# Patient Record
Sex: Female | Born: 1965 | Race: Black or African American | Hispanic: No | Marital: Single | Smoking: Never smoker
Health system: Southern US, Community
[De-identification: ages and names within clinical notes are randomized; demographics above are authoritative.]

## PROBLEM LIST (undated history)

## (undated) DIAGNOSIS — N6452 Nipple discharge: Secondary | ICD-10-CM

## (undated) DIAGNOSIS — I4892 Unspecified atrial flutter: Secondary | ICD-10-CM

## (undated) DIAGNOSIS — N62 Hypertrophy of breast: Secondary | ICD-10-CM

## (undated) DIAGNOSIS — D219 Benign neoplasm of connective and other soft tissue, unspecified: Secondary | ICD-10-CM

## (undated) DIAGNOSIS — M1990 Unspecified osteoarthritis, unspecified site: Secondary | ICD-10-CM

## (undated) DIAGNOSIS — I10 Essential (primary) hypertension: Secondary | ICD-10-CM

## (undated) DIAGNOSIS — T7840XA Allergy, unspecified, initial encounter: Secondary | ICD-10-CM

## (undated) DIAGNOSIS — R7303 Prediabetes: Secondary | ICD-10-CM

## (undated) HISTORY — DX: Benign neoplasm of connective and other soft tissue, unspecified: D21.9

## (undated) HISTORY — DX: Prediabetes: R73.03

## (undated) HISTORY — PX: BREAST EXCISIONAL BIOPSY: SUR124

## (undated) HISTORY — PX: UTERINE FIBROID SURGERY: SHX826

## (undated) HISTORY — DX: Unspecified atrial flutter: I48.92

## (undated) HISTORY — DX: Allergy, unspecified, initial encounter: T78.40XA

## (undated) HISTORY — DX: Essential (primary) hypertension: I10

---

## 2000-01-28 ENCOUNTER — Other Ambulatory Visit: Admission: RE | Admit: 2000-01-28 | Discharge: 2000-01-28 | Payer: Self-pay | Admitting: Obstetrics and Gynecology

## 2001-04-18 ENCOUNTER — Other Ambulatory Visit: Admission: RE | Admit: 2001-04-18 | Discharge: 2001-04-18 | Payer: Self-pay | Admitting: Obstetrics and Gynecology

## 2003-05-10 ENCOUNTER — Other Ambulatory Visit: Admission: RE | Admit: 2003-05-10 | Discharge: 2003-05-10 | Payer: Self-pay | Admitting: Obstetrics and Gynecology

## 2004-07-15 ENCOUNTER — Other Ambulatory Visit: Admission: RE | Admit: 2004-07-15 | Discharge: 2004-07-15 | Payer: Self-pay | Admitting: Obstetrics and Gynecology

## 2005-11-09 DIAGNOSIS — D219 Benign neoplasm of connective and other soft tissue, unspecified: Secondary | ICD-10-CM

## 2005-11-09 HISTORY — DX: Benign neoplasm of connective and other soft tissue, unspecified: D21.9

## 2005-11-25 ENCOUNTER — Ambulatory Visit (HOSPITAL_COMMUNITY): Admission: RE | Admit: 2005-11-25 | Discharge: 2005-11-25 | Payer: Self-pay | Admitting: Obstetrics and Gynecology

## 2006-03-30 ENCOUNTER — Ambulatory Visit (HOSPITAL_COMMUNITY): Admission: RE | Admit: 2006-03-30 | Discharge: 2006-03-30 | Payer: Self-pay | Admitting: Obstetrics and Gynecology

## 2008-06-12 ENCOUNTER — Ambulatory Visit (HOSPITAL_COMMUNITY): Admission: RE | Admit: 2008-06-12 | Discharge: 2008-06-12 | Payer: Self-pay | Admitting: Obstetrics and Gynecology

## 2010-11-30 ENCOUNTER — Encounter: Payer: Self-pay | Admitting: Obstetrics and Gynecology

## 2014-02-02 ENCOUNTER — Encounter: Payer: Self-pay | Admitting: Cardiovascular Disease

## 2014-02-02 ENCOUNTER — Encounter (INDEPENDENT_AMBULATORY_CARE_PROVIDER_SITE_OTHER): Payer: Self-pay

## 2014-02-02 ENCOUNTER — Ambulatory Visit (INDEPENDENT_AMBULATORY_CARE_PROVIDER_SITE_OTHER): Payer: Commercial Managed Care - PPO | Admitting: Cardiovascular Disease

## 2014-02-02 VITALS — BP 142/90 | HR 85 | Ht 67.0 in | Wt 208.5 lb

## 2014-02-02 DIAGNOSIS — I498 Other specified cardiac arrhythmias: Secondary | ICD-10-CM

## 2014-02-02 DIAGNOSIS — R9431 Abnormal electrocardiogram [ECG] [EKG]: Secondary | ICD-10-CM

## 2014-02-02 DIAGNOSIS — E669 Obesity, unspecified: Secondary | ICD-10-CM

## 2014-02-02 DIAGNOSIS — I4902 Ventricular flutter: Secondary | ICD-10-CM

## 2014-02-02 NOTE — Patient Instructions (Signed)
You are doing well. No arrhythmia noted on the EKG.  No medication changes were made.  Look on [facility] for TED hose/compression hose for leg/ankle swelling  Please call us if you have new issues that need to be addressed before your next appt.

## 2014-02-02 NOTE — Assessment & Plan Note (Signed)
Recent EKG suggesting atrial flutter is incorrect, this is from underlying artifact. EKG is essentially normal with normal sinus rhythm. No further workup needed. Clinical exam is essentially benign. No murmurs appreciated.

## 2014-02-02 NOTE — Assessment & Plan Note (Signed)
We had a long discussion about diet changes that she can make. She is unable to exercise on a regular basis given her busy work schedule. She'll try the low carbohydrate diet.

## 2014-02-02 NOTE — Progress Notes (Signed)
Patient ID: Vicki French, female    DOB: 09/25/1966, 48 y.o.   MRN: [id number]  HPI Comments: Vicki French is a very pleasant 48 year old woman with no prior cardiac history who presents by referral from [place] for evaluation of abnormal EKG, possible atrial flutter.  She reports that she went in for recent routine followup with primary care and she was told that her EKG was abnormal. She denies any fluttering, shortness of breath, near syncope or syncope. Otherwise she feels well. She try to work on her weight and feels that she is "top heavy".  She is otherwise active, works 12 hours per day, no children. Denies any significant smoking history, no diabetes.. EKG today shows normal sinus rhythm with rate 85 beats per minute with no significant ST or T wave changes Prior EKG from July 2011 showing normal sinus rhythm with T wave abnormality to the anterior precordial leads V1 through V4  EKG in question on 01/27/2012 shows normal sinus rhythm with rate 75 beats per minute with artifact EKG read atrial flutter but this is incorrect. If this were true, atrial rate would be 700 to 900 beats per minute which is not possible Typical atrial rate can run 300 beats per minute. This is likely machine artifact or one of the leads/wires has a malfunction Patient does not have a tremor     Outpatient Encounter Prescriptions as of 02/02/2014  Medication Sig  . acetaminophen (TYLENOL) 325 MG tablet Take 650 mg by mouth every 6 (six) hours as needed.  Marland Kitchen aspirin 81 MG tablet Take 81 mg by mouth daily.  Marland Kitchen ibuprofen (ADVIL,MOTRIN) 800 MG tablet Take 800 mg by mouth every 6 (six) hours as needed.  . [DISCONTINUED] Norgestimate-Ethinyl Estradiol Triphasic (ORTHO TRI-CYCLEN LO) 0.18/0.215/0.25 MG-25 MCG tab Take 1 tablet by mouth daily.    Review of Systems  Constitutional: Negative.   HENT: Negative.   Eyes: Negative.   Respiratory: Negative.   Cardiovascular: Negative.   Gastrointestinal:  Negative.   Endocrine: Negative.   Musculoskeletal: Negative.   Skin: Negative.   Allergic/Immunologic: Negative.   Neurological: Negative.   Hematological: Negative.   Psychiatric/Behavioral: Negative.   All other systems reviewed and are negative.    BP 142/90  Pulse 85  Ht 5\' 7"  (1.702 m)  Wt 208 lb 8 oz (94.575 kg)  BMI 32.65 kg/m2  Physical Exam  Nursing note and vitals reviewed. Constitutional: She is oriented to person, place, and time. She appears well-developed and well-nourished.  HENT:  Head: Normocephalic.  Nose: Nose normal.  Mouth/Throat: Oropharynx is clear and moist.  Eyes: Conjunctivae are normal. Pupils are equal, round, and reactive to light.  Neck: Normal range of motion. Neck supple. No JVD present.  Cardiovascular: Normal rate, regular rhythm, S1 normal, S2 normal, normal heart sounds and intact distal pulses.  Exam reveals no gallop and no friction rub.   No murmur heard. Pulmonary/Chest: Effort normal and breath sounds normal. No respiratory distress. She has no wheezes. She has no rales. She exhibits no tenderness.  Abdominal: Soft. Bowel sounds are normal. She exhibits no distension. There is no tenderness.  Musculoskeletal: Normal range of motion. She exhibits no edema and no tenderness.  Lymphadenopathy:    She has no cervical adenopathy.  Neurological: She is alert and oriented to person, place, and time. Coordination normal.  Skin: Skin is warm and dry. No rash noted. No erythema.  Psychiatric: She has a normal mood and affect. Her behavior is normal. Judgment  and thought content normal.    Assessment and Plan

## 2015-09-01 ENCOUNTER — Ambulatory Visit (INDEPENDENT_AMBULATORY_CARE_PROVIDER_SITE_OTHER): Payer: BLUE CROSS/BLUE SHIELD | Admitting: Physician Assistant

## 2015-09-01 VITALS — BP 140/82 | HR 88 | Temp 98.3°F | Resp 14 | Ht 67.0 in | Wt 219.6 lb

## 2015-09-01 DIAGNOSIS — N6452 Nipple discharge: Secondary | ICD-10-CM | POA: Diagnosis not present

## 2015-09-01 DIAGNOSIS — Z131 Encounter for screening for diabetes mellitus: Secondary | ICD-10-CM

## 2015-09-01 DIAGNOSIS — Z0000 Encounter for general adult medical examination without abnormal findings: Secondary | ICD-10-CM

## 2015-09-01 DIAGNOSIS — R10A1 Flank pain, right side: Secondary | ICD-10-CM

## 2015-09-01 DIAGNOSIS — Z13228 Encounter for screening for other metabolic disorders: Secondary | ICD-10-CM

## 2015-09-01 DIAGNOSIS — R109 Unspecified abdominal pain: Secondary | ICD-10-CM

## 2015-09-01 DIAGNOSIS — Z113 Encounter for screening for infections with a predominantly sexual mode of transmission: Secondary | ICD-10-CM

## 2015-09-01 DIAGNOSIS — Z1329 Encounter for screening for other suspected endocrine disorder: Secondary | ICD-10-CM

## 2015-09-01 DIAGNOSIS — N390 Urinary tract infection, site not specified: Secondary | ICD-10-CM

## 2015-09-01 DIAGNOSIS — N946 Dysmenorrhea, unspecified: Secondary | ICD-10-CM | POA: Diagnosis not present

## 2015-09-01 DIAGNOSIS — Z23 Encounter for immunization: Secondary | ICD-10-CM

## 2015-09-01 DIAGNOSIS — R82998 Other abnormal findings in urine: Secondary | ICD-10-CM

## 2015-09-01 DIAGNOSIS — Z124 Encounter for screening for malignant neoplasm of cervix: Secondary | ICD-10-CM

## 2015-09-01 DIAGNOSIS — Z13220 Encounter for screening for lipoid disorders: Secondary | ICD-10-CM

## 2015-09-01 LAB — TSH: TSH: 1.258 u[IU]/mL (ref 0.350–4.500)

## 2015-09-01 LAB — POCT URINALYSIS DIPSTICK
BILIRUBIN UA: NEGATIVE
Blood, UA: NEGATIVE
GLUCOSE UA: NEGATIVE
Ketones, UA: NEGATIVE
NITRITE UA: NEGATIVE
Protein, UA: NEGATIVE
Spec Grav, UA: 1.02
UROBILINOGEN UA: 0.2
pH, UA: 5

## 2015-09-01 LAB — POCT URINE PREGNANCY: Preg Test, Ur: NEGATIVE

## 2015-09-01 LAB — POCT WET PREP WITH KOH
Clue Cells Wet Prep HPF POC: NEGATIVE
KOH Prep POC: NEGATIVE
Trichomonas, UA: NEGATIVE
Yeast Wet Prep HPF POC: NEGATIVE

## 2015-09-01 LAB — POCT UA - MICROSCOPIC ONLY
Casts, Ur, LPF, POC: NEGATIVE
Crystals, Ur, HPF, POC: NEGATIVE
Yeast, UA: NEGATIVE

## 2015-09-01 LAB — COMPLETE METABOLIC PANEL WITH GFR
ALK PHOS: 65 U/L (ref 33–115)
ALT: 9 U/L (ref 6–29)
AST: 13 U/L (ref 10–35)
Albumin: 4.4 g/dL (ref 3.6–5.1)
BILIRUBIN TOTAL: 0.5 mg/dL (ref 0.2–1.2)
BUN: 10 mg/dL (ref 7–25)
CALCIUM: 9.8 mg/dL (ref 8.6–10.2)
CHLORIDE: 103 mmol/L (ref 98–110)
CO2: 27 mmol/L (ref 20–31)
CREATININE: 0.69 mg/dL (ref 0.50–1.10)
Glucose, Bld: 97 mg/dL (ref 65–99)
POTASSIUM: 4.1 mmol/L (ref 3.5–5.3)
Sodium: 138 mmol/L (ref 135–146)
Total Protein: 7.8 g/dL (ref 6.1–8.1)

## 2015-09-01 LAB — LIPID PANEL
CHOL/HDL RATIO: 3.5 ratio (ref <=5.0)
Cholesterol: 225 mg/dL — ABNORMAL HIGH (ref 125–200)
HDL: 64 mg/dL (ref >=46)
LDL CALC: 149 mg/dL — AB (ref <130)
TRIGLYCERIDES: 61 mg/dL (ref <150)
VLDL: 12 mg/dL (ref <30)

## 2015-09-01 LAB — PROLACTIN: Prolactin: 12.4 ng/mL

## 2015-09-01 MED ORDER — IBUPROFEN 600 MG PO TABS: 600.0000 mg | ORAL_TABLET | Freq: Three times a day (TID) | ORAL | Status: DC | PRN

## 2015-09-01 MED ORDER — CIPROFLOXACIN HCL 500 MG PO TABS: 500.0000 mg | ORAL_TABLET | Freq: Two times a day (BID) | ORAL | Status: AC

## 2015-09-01 NOTE — Progress Notes (Signed)
[facility] [address] [place] [address] [phone]  Date:  09/01/2015   Name:  Vicki French   DOB:  [birth year]   MRN:  [id number]  PCP:  Elisabeth Cara, PA-C   Chief Complaint  Patient presents with  . Annual Exam    Including Pap  . Back Pain    Lowe back, x 2 days  . Breast Discharge    Left breast x 3 weeks  . Immunizations    Possible flu vaccine, Pt states she would like to think about it at this time    History of Present Illness:  Vicki French is a 49 y.o. female patient who presents to [facility] for annual physical exam and chief complaint of 3 weeks of left breast discharge and low back pain for 2 days. Patient complains of left breast nipple discharge for about 3 weeks.  It is a clear yellow color and is intermittently expressed.  She has had no breast pain, change in skin texture or nipple inversion.  She is uptodate on breast mammograms, without hx of breast cancer.   No breast cancers in family.  Patient is has been pregnant once with miscarriage.   Also low back pain for 2 days Diet: bake and fried.  Vegetables, not a great deal .  Rice and pastas.  Not a snacker.  Regular sodas cokes, pepsi, mountain dews, koolaid, tea.  Water bottle per day. She would also like pain relief for menstrual cycle.  Generally takes ibuprofen but was wondering if ocp might be better at this time.  BM: Some constipation, no blood in the stool.  No diarrhea  Urination: Normal without dysuria, hematuria, or frequency  Sleep: Not sleeping as well.  4am, 6am   Works 11-12 hours.   No exercise at this time  EtOH: None Tobacco: Never engaged in tobacco use Illicit drug use: No illicit drug use  She is working majority of the time, and reports not having much of a social outlet.       Patient Active Problem List   Diagnosis Date Noted  . Abnormal EKG 02/02/2014  . Obesity 02/02/2014    Past Medical History  Diagnosis Date  . Atrial flutter  ([place])   . Fibroid tumor 2007    removed from uterus   . Allergy     Past Surgical History  Procedure Laterality Date  . Uterine fibroid surgery      Social History  Substance Use Topics  . Smoking status: Never Smoker   . Smokeless tobacco: None  . Alcohol Use: No    Family History  Problem Relation Age of Onset  . Hypertension Mother   . Hyperlipidemia Mother   . Diabetes Mother   . Hypertension Father   . Hyperlipidemia Father   . Cancer Father     No Known Allergies  Medication list has been reviewed and updated.  Current Outpatient Prescriptions on File Prior to Visit  Medication Sig Dispense Refill  . acetaminophen (TYLENOL) 325 MG tablet Take 650 mg by mouth every 6 (six) hours as needed.    Marland Kitchen aspirin 81 MG tablet Take 81 mg by mouth daily.    Marland Kitchen ibuprofen (ADVIL,MOTRIN) 800 MG tablet Take 800 mg by mouth every 6 (six) hours as needed.     No current facility-administered medications on file prior to visit.    Review of Systems  Constitutional: Negative for fever and chills.  HENT: Negative for  ear discharge, ear pain and sore throat.   Eyes: Negative for blurred vision and double vision.  Respiratory: Negative for cough, shortness of breath and wheezing.   Cardiovascular: Negative for chest pain, palpitations and leg swelling.  Gastrointestinal: Positive for abdominal pain (right flank pain) and constipation (at times). Negative for nausea, vomiting and diarrhea.  Genitourinary: Negative for dysuria, frequency and hematuria.  Skin: Negative for itching and rash.  Neurological: Negative for dizziness and headaches.   Results for orders placed or performed in visit on 09/01/15  POCT Wet Prep with KOH  Result Value Ref Range   Trichomonas, UA Negative    Clue Cells Wet Prep HPF POC Negative    Epithelial Wet Prep HPF POC Many Few, Moderate, Many, Too numerous to count   Yeast Wet Prep HPF POC Negative    Bacteria Wet Prep HPF POC Many (A) None, Few, Too  numerous to count   RBC Wet Prep HPF POC Few    WBC Wet Prep HPF POC Moderate    KOH Prep POC Negative   POCT urinalysis dipstick  Result Value Ref Range   Color, UA yellow    Clarity, UA clear    Glucose, UA negative    Bilirubin, UA negative    Ketones, UA negative    Spec Grav, UA 1.020    Blood, UA negative    pH, UA 5.0    Protein, UA negative    Urobilinogen, UA 0.2    Nitrite, UA negative    Leukocytes, UA small (1+) (A) Negative  POCT UA - Microscopic Only  Result Value Ref Range   WBC, Ur, HPF, POC few    RBC, urine, microscopic none    Bacteria, U Microscopic moderate    Mucus, UA present    Epithelial cells, urine per micros few    Crystals, Ur, HPF, POC negative    Casts, Ur, LPF, POC negative    Yeast, UA negative   POCT urine pregnancy  Result Value Ref Range   Preg Test, Ur Negative Negative     Physical Examination: BP 140/82 mmHg  Pulse 88  Temp(Src) 98.3 F (36.8 C) (Oral)  Resp 14  Ht 5\' 7"  (1.702 m)  Wt 219 lb 9.6 oz (99.61 kg)  BMI 34.39 kg/m2  SpO2 98%  LMP 08/23/2015 Ideal Body Weight: Weight in (lb) to have BMI = 25: 159.3  Physical Exam  Constitutional: She is oriented to person, place, and time. She appears well-developed and well-nourished. No distress.  HENT:  Head: Normocephalic and atraumatic.  Right Ear: External ear normal.  Left Ear: External ear normal.  Eyes: Conjunctivae and EOM are normal. Pupils are equal, round, and reactive to light.  Cardiovascular: Normal rate and regular rhythm.  Exam reveals no gallop, no distant heart sounds and no friction rub.   No murmur heard. Pulses:      Radial pulses are 2+ on the right side, and 2+ on the left side.       Dorsalis pedis pulses are 2+ on the right side, and 2+ on the left side.  Pulmonary/Chest: Effort normal. No respiratory distress. Right breast exhibits no inverted nipple, no mass, no nipple discharge, no skin change and no tenderness. Left breast exhibits nipple  discharge. Left breast exhibits no inverted nipple, no mass, no skin change and no tenderness. There is no breast swelling.  Left nipple with yellow serous fluid expressed with palpation.  Breast are large and heavy and contain fibrous tissue,  however immobile firm nodules were not appreciated.  No erythema or obvious swelling.    Abdominal: Soft. Normal appearance and bowel sounds are normal. There is no tenderness. There is no tenderness at McBurney's point and negative Murphy's sign.  Can not detect hepatomegaly due to body habitus  Genitourinary: Vagina normal. No breast tenderness. Pelvic exam was performed with patient supine. There is no rash on the right labia. There is no rash on the left labia. Cervix exhibits no motion tenderness, no discharge and no friability. Right adnexum displays no mass. Left adnexum displays no mass.  Neurological: She is alert and oriented to person, place, and time.  Skin: She is not diaphoretic.  Psychiatric: She has a normal mood and affect. Her behavior is normal.     Assessment and Plan: IZZA BICKLE is a 49 y.o. female who is here today for annual physical exam with concern of right flank pain, left nipple discharge.  Likely constipation.  Advised to start miralax at this time.  Urine culture placed.  Will perform diagnostic mammogram at this time.  Prolactin, tsh performed today.  Will await results prior to issuing oral contraceptives at this time.  Estrogen/progestin may be helpful at this time.  Annual physical exam - Plan: POCT Wet Prep with KOH, MM Digital Diagnostic Bilat, POCT urinalysis dipstick, POCT UA - Microscopic Only, Prolactin, TSH, Lipid panel, POCT urine pregnancy, COMPLETE METABOLIC PANEL WITH GFR, ibuprofen (ADVIL,MOTRIN) 600 MG tablet, Urine culture, Pap IG, CT/NG w/ reflex HPV when ASC-U, Flu Vaccine QUAD 36+ mos IM  Screening for thyroid disorder - Plan: TSH  Screening for lipid disorders - Plan: Lipid panel  Screening for diabetes  mellitus - Plan: Lipid panel, COMPLETE METABOLIC PANEL WITH GFR  Right flank pain - Plan: POCT urinalysis dipstick, POCT UA - Microscopic Only, POCT urine pregnancy, Urine culture, ciprofloxacin (CIPRO) 500 MG tablet  Discharge from left nipple - Plan: MM Digital Diagnostic Bilat, Prolactin, TSH, POCT urine pregnancy  Screening for STD (sexually transmitted disease) - Plan: POCT Wet Prep with KOH, Pap IG, CT/NG w/ reflex HPV when ASC-U  Screening for metabolic disorder - Plan: COMPLETE METABOLIC PANEL WITH GFR  Menstrual cramp - Plan: ibuprofen (ADVIL,MOTRIN) 600 MG tablet  Leukocytes in urine - Plan: Urine culture, ciprofloxacin (CIPRO) 222 MG tablet  Complicated UTI (urinary tract infection) - Plan: ciprofloxacin (CIPRO) 500 MG tablet  Papanicolaou smear for cervical cancer screening - Plan: Pap IG, CT/NG w/ reflex HPV when ASC-U  Need for prophylactic vaccination and inoculation against influenza - Plan: Flu Vaccine QUAD 36+ mos IM    Ivar Drape, PA-C [facility] 09/01/2015 2:10 PM

## 2015-09-01 NOTE — Patient Instructions (Addendum)
Please take the antibiotic as prescribed.   I have prescribed the ibuprofen 600mg  every 8 hours as needed for menstrual cramps.  We will hold off on oral contraceptives while we await the mammogram. I will contact you with the results of your lab work within the next 10-14 days. Listed below are helpful information on diet and exercise. You really need to try to wean yourself from soft drink beverages.  Drink more water--more than 64oz of water per day.

## 2015-09-02 LAB — URINE CULTURE
COLONY COUNT: NO GROWTH
ORGANISM ID, BACTERIA: NO GROWTH

## 2015-09-03 ENCOUNTER — Other Ambulatory Visit: Payer: Self-pay

## 2015-09-03 LAB — PAP IG, CT-NG, RFX HPV ASCU
Chlamydia Probe Amp: NEGATIVE
GC Probe Amp: NEGATIVE

## 2015-09-05 ENCOUNTER — Telehealth: Payer: Self-pay

## 2015-09-05 NOTE — Telephone Encounter (Signed)
Mammo requested [place]   Fax number  [phone]

## 2015-09-10 NOTE — Telephone Encounter (Signed)
I am not sure what is needed from me.  Imaging request was sent for nipple discharge during office visit.

## 2015-09-11 ENCOUNTER — Other Ambulatory Visit: Payer: Self-pay

## 2015-09-11 NOTE — Telephone Encounter (Signed)
I think this is the one I had questions about.

## 2015-09-14 ENCOUNTER — Other Ambulatory Visit: Payer: Self-pay | Admitting: Physician Assistant

## 2015-09-14 ENCOUNTER — Other Ambulatory Visit: Payer: Self-pay

## 2015-09-14 DIAGNOSIS — N6452 Nipple discharge: Secondary | ICD-10-CM

## 2015-09-16 ENCOUNTER — Other Ambulatory Visit: Payer: Self-pay

## 2015-09-16 DIAGNOSIS — N6452 Nipple discharge: Secondary | ICD-10-CM

## 2015-09-24 ENCOUNTER — Ambulatory Visit
Admission: RE | Admit: 2015-09-24 | Discharge: 2015-09-24 | Disposition: A | Discharge status: Home / Self Care | Payer: BLUE CROSS/BLUE SHIELD | Source: Ambulatory Visit | Attending: Physician Assistant | Admitting: Physician Assistant

## 2015-09-24 ENCOUNTER — Other Ambulatory Visit: Payer: Self-pay | Admitting: Physician Assistant

## 2015-09-24 DIAGNOSIS — N6452 Nipple discharge: Secondary | ICD-10-CM

## 2015-09-27 ENCOUNTER — Ambulatory Visit
Admission: RE | Admit: 2015-09-27 | Discharge: 2015-09-27 | Disposition: A | Discharge status: Home / Self Care | Payer: BLUE CROSS/BLUE SHIELD | Source: Ambulatory Visit | Attending: Physician Assistant | Admitting: Physician Assistant

## 2015-09-27 ENCOUNTER — Other Ambulatory Visit: Payer: Self-pay | Admitting: Physician Assistant

## 2015-09-27 DIAGNOSIS — N6452 Nipple discharge: Secondary | ICD-10-CM

## 2015-09-27 HISTORY — PX: BREAST BIOPSY: SHX20

## 2015-11-15 DIAGNOSIS — N62 Hypertrophy of breast: Secondary | ICD-10-CM | POA: Insufficient documentation

## 2015-11-15 DIAGNOSIS — N63 Unspecified lump in unspecified breast: Secondary | ICD-10-CM | POA: Insufficient documentation

## 2015-11-15 DIAGNOSIS — N6452 Nipple discharge: Secondary | ICD-10-CM | POA: Insufficient documentation

## 2016-11-09 HISTORY — PX: BREAST BIOPSY: SHX20

## 2017-02-05 ENCOUNTER — Other Ambulatory Visit: Payer: Self-pay | Admitting: General Surgery

## 2017-02-05 DIAGNOSIS — Z1231 Encounter for screening mammogram for malignant neoplasm of breast: Secondary | ICD-10-CM

## 2017-02-18 ENCOUNTER — Ambulatory Visit
Admission: RE | Admit: 2017-02-18 | Discharge: 2017-02-18 | Disposition: A | Discharge status: Home / Self Care | Payer: BLUE CROSS/BLUE SHIELD | Source: Ambulatory Visit | Attending: General Surgery | Admitting: General Surgery

## 2017-02-18 DIAGNOSIS — Z1231 Encounter for screening mammogram for malignant neoplasm of breast: Secondary | ICD-10-CM

## 2017-02-19 ENCOUNTER — Other Ambulatory Visit: Payer: Self-pay | Admitting: General Surgery

## 2017-02-19 DIAGNOSIS — R928 Other abnormal and inconclusive findings on diagnostic imaging of breast: Secondary | ICD-10-CM

## 2017-03-15 ENCOUNTER — Ambulatory Visit
Admission: RE | Admit: 2017-03-15 | Discharge: 2017-03-15 | Disposition: A | Discharge status: Home / Self Care | Payer: BLUE CROSS/BLUE SHIELD | Source: Ambulatory Visit | Attending: General Surgery | Admitting: General Surgery

## 2017-03-15 ENCOUNTER — Other Ambulatory Visit: Payer: Self-pay | Admitting: General Surgery

## 2017-03-15 DIAGNOSIS — R928 Other abnormal and inconclusive findings on diagnostic imaging of breast: Secondary | ICD-10-CM

## 2017-04-02 ENCOUNTER — Ambulatory Visit
Admission: RE | Admit: 2017-04-02 | Discharge: 2017-04-02 | Disposition: A | Discharge status: Home / Self Care | Payer: BLUE CROSS/BLUE SHIELD | Source: Ambulatory Visit | Attending: General Surgery | Admitting: General Surgery

## 2017-04-02 ENCOUNTER — Other Ambulatory Visit: Payer: Self-pay | Admitting: General Surgery

## 2017-04-02 DIAGNOSIS — R928 Other abnormal and inconclusive findings on diagnostic imaging of breast: Secondary | ICD-10-CM

## 2017-05-03 ENCOUNTER — Other Ambulatory Visit: Payer: Self-pay | Admitting: General Surgery

## 2017-05-03 DIAGNOSIS — N6452 Nipple discharge: Secondary | ICD-10-CM

## 2017-05-16 ENCOUNTER — Other Ambulatory Visit: Payer: BLUE CROSS/BLUE SHIELD

## 2017-05-24 ENCOUNTER — Ambulatory Visit
Admission: RE | Admit: 2017-05-24 | Discharge: 2017-05-24 | Disposition: A | Discharge status: Home / Self Care | Payer: BLUE CROSS/BLUE SHIELD | Source: Ambulatory Visit | Attending: General Surgery | Admitting: General Surgery

## 2017-05-24 DIAGNOSIS — N6452 Nipple discharge: Secondary | ICD-10-CM

## 2017-05-24 MED ORDER — GADOBENATE DIMEGLUMINE 529 MG/ML IV SOLN
19.0000 mL | Freq: Once | INTRAVENOUS | Status: AC | PRN
Start: 2017-05-24 — End: 2017-05-24
  Administered 2017-05-24: 19 mL via INTRAVENOUS

## 2017-06-08 ENCOUNTER — Other Ambulatory Visit: Payer: Self-pay | Admitting: General Surgery

## 2017-06-08 DIAGNOSIS — N6452 Nipple discharge: Secondary | ICD-10-CM

## 2017-06-28 ENCOUNTER — Other Ambulatory Visit: Payer: Self-pay | Admitting: General Surgery

## 2017-06-28 ENCOUNTER — Ambulatory Visit
Admission: RE | Admit: 2017-06-28 | Discharge: 2017-06-28 | Disposition: A | Discharge status: Home / Self Care | Payer: BLUE CROSS/BLUE SHIELD | Source: Ambulatory Visit | Attending: General Surgery | Admitting: General Surgery

## 2017-06-28 DIAGNOSIS — N6452 Nipple discharge: Secondary | ICD-10-CM

## 2017-06-28 DIAGNOSIS — N6320 Unspecified lump in the left breast, unspecified quadrant: Secondary | ICD-10-CM

## 2017-07-06 ENCOUNTER — Ambulatory Visit: Payer: Self-pay | Admitting: General Surgery

## 2017-07-09 ENCOUNTER — Inpatient Hospital Stay: Admission: RE | Admit: 2017-07-09 | Payer: BLUE CROSS/BLUE SHIELD | Source: Ambulatory Visit

## 2017-07-20 ENCOUNTER — Encounter (HOSPITAL_BASED_OUTPATIENT_CLINIC_OR_DEPARTMENT_OTHER): Payer: Self-pay | Admitting: *Deleted

## 2017-07-21 NOTE — Progress Notes (Signed)
Pts family given boost with instructions DOS. Have completed by 0815 hrs.  NPO otherwise

## 2017-07-23 ENCOUNTER — Ambulatory Visit (HOSPITAL_BASED_OUTPATIENT_CLINIC_OR_DEPARTMENT_OTHER): Payer: BLUE CROSS/BLUE SHIELD

## 2017-07-23 ENCOUNTER — Encounter (HOSPITAL_BASED_OUTPATIENT_CLINIC_OR_DEPARTMENT_OTHER)
Admission: RE | Disposition: A | Discharge status: Home / Self Care | Payer: Self-pay | Source: Ambulatory Visit | Attending: General Surgery

## 2017-07-23 ENCOUNTER — Ambulatory Visit (HOSPITAL_BASED_OUTPATIENT_CLINIC_OR_DEPARTMENT_OTHER)
Admission: RE | Admit: 2017-07-23 | Discharge: 2017-07-23 | Disposition: A | Discharge status: Home / Self Care | Payer: BLUE CROSS/BLUE SHIELD | Source: Ambulatory Visit | Attending: General Surgery | Admitting: General Surgery

## 2017-07-23 ENCOUNTER — Encounter (HOSPITAL_BASED_OUTPATIENT_CLINIC_OR_DEPARTMENT_OTHER): Payer: Self-pay | Admitting: Certified Registered"

## 2017-07-23 DIAGNOSIS — D242 Benign neoplasm of left breast: Secondary | ICD-10-CM | POA: Insufficient documentation

## 2017-07-23 DIAGNOSIS — N6452 Nipple discharge: Secondary | ICD-10-CM | POA: Insufficient documentation

## 2017-07-23 HISTORY — DX: Unspecified osteoarthritis, unspecified site: M19.90

## 2017-07-23 HISTORY — DX: Nipple discharge: N64.52

## 2017-07-23 HISTORY — PX: BREAST DUCTAL SYSTEM EXCISION: SHX5242

## 2017-07-23 SURGERY — EXCISION DUCTAL SYSTEM BREAST
Anesthesia: General | Site: Breast | Laterality: Left

## 2017-07-23 MED ORDER — CELECOXIB 200 MG PO CAPS
ORAL_CAPSULE | ORAL | Status: AC
  Filled 2017-07-23: qty 2

## 2017-07-23 MED ORDER — CHLORHEXIDINE GLUCONATE CLOTH 2 % EX PADS: 6.0000 | MEDICATED_PAD | Freq: Once | CUTANEOUS | Status: DC

## 2017-07-23 MED ORDER — MIDAZOLAM HCL 2 MG/2ML IJ SOLN
1.0000 mg | INTRAMUSCULAR | Status: DC | PRN
  Administered 2017-07-23: 2 mg via INTRAVENOUS

## 2017-07-23 MED ORDER — EPHEDRINE SULFATE 50 MG/ML IJ SOLN
INTRAMUSCULAR | Status: DC | PRN
  Administered 2017-07-23: 10 mg via INTRAVENOUS

## 2017-07-23 MED ORDER — HYDROMORPHONE HCL 1 MG/ML IJ SOLN: 0.2500 mg | INTRAMUSCULAR | Status: DC | PRN

## 2017-07-23 MED ORDER — ACETAMINOPHEN 500 MG PO TABS
1000.0000 mg | ORAL_TABLET | ORAL | Status: AC
  Administered 2017-07-23: 1000 mg via ORAL

## 2017-07-23 MED ORDER — GABAPENTIN 300 MG PO CAPS
ORAL_CAPSULE | ORAL | Status: AC
  Filled 2017-07-23: qty 1

## 2017-07-23 MED ORDER — LIDOCAINE HCL (CARDIAC) 20 MG/ML IV SOLN
INTRAVENOUS | Status: DC | PRN
  Administered 2017-07-23: 60 mg via INTRAVENOUS

## 2017-07-23 MED ORDER — BUPIVACAINE-EPINEPHRINE 0.25% -1:200000 IJ SOLN
INTRAMUSCULAR | Status: DC | PRN
  Administered 2017-07-23: 20 mL

## 2017-07-23 MED ORDER — FENTANYL CITRATE (PF) 100 MCG/2ML IJ SOLN
INTRAMUSCULAR | Status: AC
  Filled 2017-07-23: qty 2

## 2017-07-23 MED ORDER — OXYCODONE HCL 5 MG PO TABS: 5.0000 mg | ORAL_TABLET | Freq: Once | ORAL | Status: DC | PRN

## 2017-07-23 MED ORDER — FENTANYL CITRATE (PF) 100 MCG/2ML IJ SOLN
50.0000 ug | INTRAMUSCULAR | Status: DC | PRN
  Administered 2017-07-23 (×2): 50 ug via INTRAVENOUS

## 2017-07-23 MED ORDER — CEFAZOLIN SODIUM-DEXTROSE 2-4 GM/100ML-% IV SOLN
2.0000 g | INTRAVENOUS | Status: AC
  Administered 2017-07-23: 2 g via INTRAVENOUS

## 2017-07-23 MED ORDER — CELECOXIB 400 MG PO CAPS
400.0000 mg | ORAL_CAPSULE | ORAL | Status: AC
  Administered 2017-07-23: 400 mg via ORAL

## 2017-07-23 MED ORDER — DEXAMETHASONE SODIUM PHOSPHATE 4 MG/ML IJ SOLN
INTRAMUSCULAR | Status: DC | PRN
  Administered 2017-07-23: 10 mg via INTRAVENOUS

## 2017-07-23 MED ORDER — HYDROCODONE-ACETAMINOPHEN 5-325 MG PO TABS: 1.0000 | ORAL_TABLET | ORAL | 0 refills | Status: DC | PRN

## 2017-07-23 MED ORDER — MIDAZOLAM HCL 2 MG/2ML IJ SOLN
INTRAMUSCULAR | Status: AC
  Filled 2017-07-23: qty 2

## 2017-07-23 MED ORDER — GABAPENTIN 300 MG PO CAPS
300.0000 mg | ORAL_CAPSULE | ORAL | Status: AC
  Administered 2017-07-23: 300 mg via ORAL

## 2017-07-23 MED ORDER — FENTANYL CITRATE (PF) 100 MCG/2ML IJ SOLN: 25.0000 ug | INTRAMUSCULAR | Status: DC | PRN

## 2017-07-23 MED ORDER — LACTATED RINGERS IV SOLN: INTRAVENOUS | Status: DC

## 2017-07-23 MED ORDER — SCOPOLAMINE 1 MG/3DAYS TD PT72: 1.0000 | MEDICATED_PATCH | Freq: Once | TRANSDERMAL | Status: DC | PRN

## 2017-07-23 MED ORDER — LACTATED RINGERS IV SOLN
INTRAVENOUS | Status: DC
  Administered 2017-07-23: 11:00:00 via INTRAVENOUS

## 2017-07-23 MED ORDER — MEPERIDINE HCL 25 MG/ML IJ SOLN: 6.2500 mg | INTRAMUSCULAR | Status: DC | PRN

## 2017-07-23 MED ORDER — OXYCODONE HCL 5 MG/5ML PO SOLN: 5.0000 mg | Freq: Once | ORAL | Status: DC | PRN

## 2017-07-23 MED ORDER — PROMETHAZINE HCL 25 MG/ML IJ SOLN: 6.2500 mg | INTRAMUSCULAR | Status: DC | PRN

## 2017-07-23 MED ORDER — ACETAMINOPHEN 500 MG PO TABS
ORAL_TABLET | ORAL | Status: AC
  Filled 2017-07-23: qty 2

## 2017-07-23 MED ORDER — PROPOFOL 10 MG/ML IV BOLUS
INTRAVENOUS | Status: DC | PRN
  Administered 2017-07-23: 150 mg via INTRAVENOUS

## 2017-07-23 MED ORDER — CEFAZOLIN SODIUM-DEXTROSE 2-4 GM/100ML-% IV SOLN
INTRAVENOUS | Status: AC
  Filled 2017-07-23: qty 100

## 2017-07-23 SURGICAL SUPPLY — 40 items
ADH SKN CLS APL DERMABOND .7 (GAUZE/BANDAGES/DRESSINGS) ×1
BLADE SURG 15 STRL LF DISP TIS (BLADE) ×1 IMPLANT
BLADE SURG 15 STRL SS (BLADE) ×2
CANISTER SUCT 1200ML W/VALVE (MISCELLANEOUS) ×2 IMPLANT
CHLORAPREP W/TINT 26ML (MISCELLANEOUS) ×2 IMPLANT
CLIP VESOCCLUDE SM WIDE 6/CT (CLIP) IMPLANT
COVER BACK TABLE 60X90IN (DRAPES) ×2 IMPLANT
COVER MAYO STAND STRL (DRAPES) ×2 IMPLANT
DECANTER SPIKE VIAL GLASS SM (MISCELLANEOUS) IMPLANT
DERMABOND ADVANCED (GAUZE/BANDAGES/DRESSINGS) ×1
DERMABOND ADVANCED .7 DNX12 (GAUZE/BANDAGES/DRESSINGS) ×1 IMPLANT
DEVICE DUBIN W/COMP PLATE 8390 (MISCELLANEOUS) IMPLANT
DRAPE LAPAROSCOPIC ABDOMINAL (DRAPES) ×2 IMPLANT
DRAPE UTILITY XL STRL (DRAPES) ×2 IMPLANT
ELECT COATED BLADE 2.86 ST (ELECTRODE) ×2 IMPLANT
ELECT REM PT RETURN 9FT ADLT (ELECTROSURGICAL) ×2
ELECTRODE REM PT RTRN 9FT ADLT (ELECTROSURGICAL) ×1 IMPLANT
GLOVE BIO SURGEON STRL SZ 6.5 (GLOVE) ×1 IMPLANT
GLOVE BIO SURGEON STRL SZ7.5 (GLOVE) ×2 IMPLANT
GLOVE BIOGEL PI IND STRL 7.0 (GLOVE) ×1 IMPLANT
GLOVE BIOGEL PI INDICATOR 7.0 (GLOVE) ×1
GOWN STRL REUS W/ TWL LRG LVL3 (GOWN DISPOSABLE) ×2 IMPLANT
GOWN STRL REUS W/TWL LRG LVL3 (GOWN DISPOSABLE) ×4
KIT MARKER MARGIN INK (KITS) ×2 IMPLANT
NDL HYPO 25X1 1.5 SAFETY (NEEDLE) ×1 IMPLANT
NEEDLE HYPO 25X1 1.5 SAFETY (NEEDLE) ×2 IMPLANT
NS IRRIG 1000ML POUR BTL (IV SOLUTION) ×2 IMPLANT
PACK BASIN DAY SURGERY FS (CUSTOM PROCEDURE TRAY) ×2 IMPLANT
PENCIL BUTTON HOLSTER BLD 10FT (ELECTRODE) ×2 IMPLANT
SLEEVE SCD COMPRESS KNEE MED (MISCELLANEOUS) ×2 IMPLANT
SPONGE LAP 18X18 X RAY DECT (DISPOSABLE) ×2 IMPLANT
STAPLER VISISTAT 35W (STAPLE) IMPLANT
SUT MON AB 4-0 PC3 18 (SUTURE) ×2 IMPLANT
SUT SILK 2 0 SH (SUTURE) ×2 IMPLANT
SUT VICRYL 3-0 CR8 SH (SUTURE) ×2 IMPLANT
SYR CONTROL 10ML LL (SYRINGE) ×2 IMPLANT
TOWEL OR 17X24 6PK STRL BLUE (TOWEL DISPOSABLE) ×2 IMPLANT
TOWEL OR NON WOVEN STRL DISP B (DISPOSABLE) ×2 IMPLANT
TUBE CONNECTING 20X1/4 (TUBING) ×2 IMPLANT
YANKAUER SUCT BULB TIP NO VENT (SUCTIONS) ×2 IMPLANT

## 2017-07-23 NOTE — Anesthesia Postprocedure Evaluation (Signed)
Anesthesia Post Note  Patient: Vicki French  Procedure(s) Performed: Procedure(s) (LRB): LEFT BREAST SUBAREOLAR DUCTAL EXCISION (Left)     Patient location during evaluation: PACU Anesthesia Type: General Level of consciousness: awake and alert Pain management: pain level controlled Vital Signs Assessment: post-procedure vital signs reviewed and stable Respiratory status: spontaneous breathing, nonlabored ventilation, respiratory function stable and patient connected to nasal cannula oxygen Cardiovascular status: blood pressure returned to baseline and stable Postop Assessment: no apparent nausea or vomiting Anesthetic complications: no    Last Vitals:  Vitals:   07/23/17 1215 07/23/17 1230  BP: 114/65 136/82  Pulse: (!) 108 (!) 102  Resp: 18 13  Temp:    SpO2: 100% 97%    Last Pain:  Vitals:   07/23/17 1230  TempSrc:   PainSc: 0-No pain                 Aldean Suddeth,JAMES TERRILL

## 2017-07-23 NOTE — Anesthesia Procedure Notes (Signed)
Procedure Name: LMA Insertion Date/Time: 07/23/2017 11:15 AM Performed by: Mikal Wisman D Pre-anesthesia Checklist: Patient identified, Emergency Drugs available, Suction available and Patient being monitored Patient Re-evaluated:Patient Re-evaluated prior to induction Oxygen Delivery Method: Circle system utilized Preoxygenation: Pre-oxygenation with 100% oxygen Induction Type: IV induction Ventilation: Mask ventilation without difficulty LMA: LMA inserted LMA Size: 3.0 Number of attempts: 1 Airway Equipment and Method: Bite block Placement Confirmation: positive ETCO2 Tube secured with: Tape Dental Injury: Teeth and Oropharynx as per pre-operative assessment

## 2017-07-23 NOTE — Discharge Instructions (Signed)

## 2017-07-23 NOTE — H&P (Signed)
Vicki French  Location: [facility] Surgery Patient #: [id number] DOB: 1966-08-15 Single / Language: Vicki French / Race: Black or African American Female   History of Present Illness The patient is a 51 year old female who presents for a follow-up for Breast mass. The patient is a 50 year old black female who we saw about a year and a half ago with discharge from the left nipple. At that time she had some fibrocystic appearing tissue with dilated ducts in the subareolar left breast. On her most recent ultrasound she still has the same findings as well as what appears to be an intraductal mass in that same location. She has had persistent clear yellow discharge from the left nipple. She denies any breast pain.   Allergies No Known Drug Allergies  Allergies Reconciled   Medication History No Current Medications Medications Reconciled    Review of Systems  General Not Present- Appetite Loss, Chills, Fatigue, Fever, Night Sweats, Weight Gain and Weight Loss. Skin Not Present- Change in Wart/Mole, Dryness, Hives, Jaundice, New Lesions, Non-Healing Wounds, Rash and Ulcer. HEENT Not Present- Earache, Hearing Loss, Hoarseness, Nose Bleed, Oral Ulcers, Ringing in the Ears, Seasonal Allergies, Sinus Pain, Sore Throat, Visual Disturbances, Wears glasses/contact lenses and Yellow Eyes. Respiratory Not Present- Bloody sputum, Chronic Cough, Difficulty Breathing, Snoring and Wheezing. Breast Present- Nipple Discharge. Not Present- Breast Mass, Breast Pain and Skin Changes. Cardiovascular Not Present- Chest Pain, Difficulty Breathing Lying Down, Leg Cramps, Palpitations, Rapid Heart Rate, Shortness of Breath and Swelling of Extremities. Gastrointestinal Not Present- Abdominal Pain, Bloating, Bloody Stool, Change in Bowel Habits, Chronic diarrhea, Constipation, Difficulty Swallowing, Excessive gas, Gets full quickly at meals, Hemorrhoids, Indigestion, Nausea, Rectal Pain and Vomiting. Female  Genitourinary Not Present- Frequency, Nocturia, Painful Urination, Pelvic Pain and Urgency. Musculoskeletal Not Present- Back Pain, Joint Pain, Joint Stiffness, Muscle Pain, Muscle Weakness and Swelling of Extremities. Neurological Not Present- Decreased Memory, Fainting, Headaches, Numbness, Seizures, Tingling, Tremor, Trouble walking and Weakness. Psychiatric Not Present- Anxiety, Bipolar, Change in Sleep Pattern, Depression, Fearful and Frequent crying. Endocrine Not Present- Cold Intolerance, Excessive Hunger, Hair Changes, Heat Intolerance, Hot flashes and New Diabetes. Hematology Not Present- Easy Bruising, Excessive bleeding, Gland problems, HIV and Persistent Infections.  Vitals  Weight: 221.4 lb Height: 68in Body Surface Area: 2.13 m Body Mass Index: 33.66 kg/m  Temp.: 97.52F  Pulse: 85 (Regular)  P.OX: 98% (Room air) BP: 128/82 (Sitting, Left Arm, Standard)       Physical Exam  General Mental Status-Alert. General Appearance-Consistent with stated age. Hydration-Well hydrated. Voice-Normal.  Head and Neck Head-normocephalic, atraumatic with no lesions or palpable masses. Trachea-midline. Thyroid Gland Characteristics - normal size and consistency.  Eye Eyeball - Bilateral-Extraocular movements intact. Sclera/Conjunctiva - Bilateral-No scleral icterus.  Chest and Lung Exam Chest and lung exam reveals -quiet, even and easy respiratory effort with no use of accessory muscles and on auscultation, normal breath sounds, no adventitious sounds and normal vocal resonance. Inspection Chest Wall - Normal. Back - normal.  Breast Note: There is no palpable mass in either breast. There is no palpable axillary, supraclavicular, or cervical lymphadenopathy. She does have clear discharge from the left nipple in the lower outer quadrant   Cardiovascular Cardiovascular examination reveals -normal heart sounds, regular rate and rhythm with no  murmurs and normal pedal pulses bilaterally.  Abdomen Inspection Inspection of the abdomen reveals - No Hernias. Skin - Scar - no surgical scars. Palpation/Percussion Palpation and Percussion of the abdomen reveal - Soft, Non Tender, No Rebound tenderness,  No Rigidity (guarding) and No hepatosplenomegaly. Auscultation Auscultation of the abdomen reveals - Bowel sounds normal.  Neurologic Neurologic evaluation reveals -alert and oriented x 3 with no impairment of recent or remote memory. Mental Status-Normal.  Musculoskeletal Normal Exam - Left-Upper Extremity Strength Normal and Lower Extremity Strength Normal. Normal Exam - Right-Upper Extremity Strength Normal and Lower Extremity Strength Normal.  Lymphatic Head & Neck  General Head & Neck Lymphatics: Bilateral - Description - Normal. Axillary  General Axillary Region: Bilateral - Description - Normal. Tenderness - Non Tender. Femoral & Inguinal  Generalized Femoral & Inguinal Lymphatics: Bilateral - Description - Normal. Tenderness - Non Tender.    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 Because of the appearance of the mass and because the drainage has been persistent and think it would be reasonable to remove this subareolar area of the left breast. She is willing to have surgery at this point. I will discuss this with the radiologist to see if we need to localize the area in anyway or do a simple subareolar duct excision. I have discussed with her in detail the risks and benefits of the operation as well as some of the technical aspects and she understands and wishes to proceed

## 2017-07-23 NOTE — Interval H&P Note (Signed)
History and Physical Interval Note:  07/23/2017 10:57 AM  Vicki French  has presented today for surgery, with the diagnosis of left nipple discharge  The various methods of treatment have been discussed with the patient and family. After consideration of risks, benefits and other options for treatment, the patient has consented to  Procedure(s): LEFT BREAST SUBAREOLAR [place] (Left) as a surgical intervention .  The patient's history has been reviewed, patient examined, no change in status, stable for surgery.  I have reviewed the patient's chart and labs.  Questions were answered to the patient's satisfaction.     TOTH III,Ivoree Felmlee S

## 2017-07-23 NOTE — Anesthesia Preprocedure Evaluation (Addendum)
Anesthesia Evaluation  Patient identified by MRN, date of birth, ID band Patient awake    Reviewed: Allergy & Precautions, NPO status , Patient's Chart, lab work & pertinent test results  Airway Mallampati: I  TM Distance: >3 FB Neck ROM: Full    Dental no notable dental hx.    Pulmonary neg pulmonary ROS,    breath sounds clear to auscultation       Cardiovascular negative cardio ROS   Rhythm:Regular Rate:Normal     Neuro/Psych negative neurological ROS     GI/Hepatic negative GI ROS, Neg liver ROS,   Endo/Other  negative endocrine ROS  Renal/GU negative Renal ROS     Musculoskeletal  (+) Arthritis ,   Abdominal   Peds  Hematology negative hematology ROS (+)   Anesthesia Other Findings Day of surgery medications reviewed with the patient.  Reproductive/Obstetrics                            Anesthesia Physical Anesthesia Plan  ASA: II  Anesthesia Plan: General   Post-op Pain Management:    Induction: Intravenous  PONV Risk Score and Plan: 4 or greater and Ondansetron, Dexamethasone, Midazolam, Scopolamine patch - Pre-op and Treatment may vary due to age or medical condition  Airway Management Planned: LMA  Additional Equipment:   Intra-op Plan:   Post-operative Plan: Extubation in OR  Informed Consent: I have reviewed the patients History and Physical, chart, labs and discussed the procedure including the risks, benefits and alternatives for the proposed anesthesia with the patient or authorized representative who has indicated his/her understanding and acceptance.   Dental advisory given  Plan Discussed with: CRNA  Anesthesia Plan Comments:         Anesthesia Quick Evaluation

## 2017-07-23 NOTE — Op Note (Signed)
07/23/2017  11:59 AM  PATIENT:  Vicki French  51 y.o. female  PRE-OPERATIVE DIAGNOSIS:  left nipple discharge  POST-OPERATIVE DIAGNOSIS:  left nipple discharge  PROCEDURE:  Procedure(s): LEFT BREAST SUBAREOLAR DUCTAL EXCISION (Left)  SURGEON:  Surgeon(s) and Role:    * Jovita Kussmaul, MD - Primary  PHYSICIAN ASSISTANT:   ASSISTANTS: none   ANESTHESIA:   local and general  EBL:  Total I/O In: 700 [I.V.:700] Out: -   BLOOD ADMINISTERED:none  DRAINS: none   LOCAL MEDICATIONS USED:  MARCAINE     SPECIMEN:  Source of Specimen:  left breast subareolar tissue  DISPOSITION OF SPECIMEN:  PATHOLOGY  COUNTS:  YES  TOURNIQUET:  * No tourniquets in log *  DICTATION: .Dragon Dictation   After informed consent was obtained the patient was brought to the operating room and placed in the supine position on the operating room table. After adequate induction of general anesthesia the patient's left breast was prepped with ChloraPrep, allowed to dry, and draped in usual sterile manner. An appropriate timeout was performed. With gentle pressure we were able to reproduce the discharge from the left nipple. The discharge seemed to be coming from a duct in the lower outer quadrant. We were able to cannulate the duct with a small silver probe. The probe also localized to the lower outer quadrant. The area around this was infiltrated with quarter percent Marcaine. A curvilinear incision was made along the lower outer edge of the areola with a 15 blade knife. The incision was carried through the skin and subcutaneous tissue sharply with electrocautery. The subareolar ductal tissue in that quadrant was then excised sharply with the electrocautery. There were multiple dilated ducts and a large appearing papillomatous type mass in one of the ducts very close to the nipple. This was all taken together. Once the tissue was removed it was oriented with the appropriate paint colors. A specimen radiograph was  obtained that showed the clip to be within the specimen. The specimen was then sent to pathology for further evaluation. Hemostasis was achieved using the Bovie electrocautery. The wound was infiltrated with more quarter percent Marcaine and irrigated with saline. The deep layer of the wound was then closed with layers of interrupted 3-0 Vicryl stitches. The skin was then closed with interrupted 4-0 Monocryl subcuticular stitches. Dermabond dressings were applied. The patient tolerated the procedure well. At the end of the case all needle sponge and instrument counts were correct. The patient was then awakened and taken to recovery in stable condition.  PLAN OF CARE: Discharge to home after PACU  PATIENT DISPOSITION:  PACU - hemodynamically stable.   Delay start of Pharmacological VTE agent (>24hrs) due to surgical blood loss or risk of bleeding: not applicable

## 2017-07-23 NOTE — Transfer of Care (Signed)
Immediate Anesthesia Transfer of Care Note  Patient: Vicki French  Procedure(s) Performed: Procedure(s): LEFT BREAST SUBAREOLAR DUCTAL EXCISION (Left)  Patient Location: PACU  Anesthesia Type:General  Level of Consciousness: awake and patient cooperative  Airway & Oxygen Therapy: Patient Spontanous Breathing and Patient connected to face mask oxygen  Post-op Assessment: Report given to RN and Post -op Vital signs reviewed and stable  Post vital signs: Reviewed and stable  Last Vitals:  Vitals:   07/23/17 1018  BP: (!) 151/83  Pulse: 87  Resp: 18  Temp: 36.9 C  SpO2: 100%    Last Pain:  Vitals:   07/23/17 1018  TempSrc: Oral      Patients Stated Pain Goal: 0 (48/18/56 3149)  Complications: No apparent anesthesia complications

## 2017-07-26 ENCOUNTER — Encounter (HOSPITAL_BASED_OUTPATIENT_CLINIC_OR_DEPARTMENT_OTHER): Payer: Self-pay | Admitting: General Surgery

## 2017-09-01 ENCOUNTER — Ambulatory Visit (INDEPENDENT_AMBULATORY_CARE_PROVIDER_SITE_OTHER): Payer: BLUE CROSS/BLUE SHIELD | Admitting: Physician Assistant

## 2017-09-01 ENCOUNTER — Encounter: Payer: Self-pay | Admitting: Physician Assistant

## 2017-09-01 VITALS — BP 156/82 | HR 83 | Temp 98.7°F | Resp 16 | Ht 67.0 in | Wt 224.6 lb

## 2017-09-01 DIAGNOSIS — Z1329 Encounter for screening for other suspected endocrine disorder: Secondary | ICD-10-CM

## 2017-09-01 DIAGNOSIS — Z113 Encounter for screening for infections with a predominantly sexual mode of transmission: Secondary | ICD-10-CM | POA: Diagnosis not present

## 2017-09-01 DIAGNOSIS — Z13228 Encounter for screening for other metabolic disorders: Secondary | ICD-10-CM

## 2017-09-01 DIAGNOSIS — Z13220 Encounter for screening for lipoid disorders: Secondary | ICD-10-CM | POA: Diagnosis not present

## 2017-09-01 DIAGNOSIS — Z0000 Encounter for general adult medical examination without abnormal findings: Secondary | ICD-10-CM

## 2017-09-01 DIAGNOSIS — Z124 Encounter for screening for malignant neoplasm of cervix: Secondary | ICD-10-CM

## 2017-09-01 DIAGNOSIS — Z131 Encounter for screening for diabetes mellitus: Secondary | ICD-10-CM

## 2017-09-01 DIAGNOSIS — M549 Dorsalgia, unspecified: Secondary | ICD-10-CM | POA: Diagnosis not present

## 2017-09-01 DIAGNOSIS — Z23 Encounter for immunization: Secondary | ICD-10-CM

## 2017-09-01 DIAGNOSIS — Z130 Encounter for screening for diseases of the blood and blood-forming organs and certain disorders involving the immune mechanism: Secondary | ICD-10-CM | POA: Diagnosis not present

## 2017-09-01 DIAGNOSIS — Z1211 Encounter for screening for malignant neoplasm of colon: Secondary | ICD-10-CM | POA: Diagnosis not present

## 2017-09-01 LAB — POCT URINALYSIS DIP (MANUAL ENTRY)
Bilirubin, UA: NEGATIVE
GLUCOSE UA: NEGATIVE mg/dL
Ketones, POC UA: NEGATIVE mg/dL
Leukocytes, UA: NEGATIVE
Nitrite, UA: NEGATIVE
Protein Ur, POC: NEGATIVE mg/dL
RBC UA: NEGATIVE
Spec Grav, UA: >=1.03 — AB (ref 1.010–1.025)
UROBILINOGEN UA: 0.2 U/dL
pH, UA: 5.5 (ref 5.0–8.0)

## 2017-09-01 MED ORDER — IBUPROFEN 600 MG PO TABS: 600.0000 mg | ORAL_TABLET | Freq: Three times a day (TID) | ORAL | 0 refills | Status: DC | PRN

## 2017-09-01 NOTE — Patient Instructions (Signed)

## 2017-09-01 NOTE — Progress Notes (Addendum)
PRIMARY CARE AT [facility] [address] [place] [address] [phone]  Date:  09/01/2017   Name:  Vicki French   DOB:  [birth year]   MRN:  [id number]  PCP:  Elisabeth Cara, PA-C    History of Present Illness:  Vicki French is a 51 y.o. female patient who presents to PCP with  Chief Complaint  Patient presents with  . Annual Exam    with pap     DIET: She is eating a lot of pastas and rice.  She eats greens occasionally.  She is eating a lot of meats.  Sodas 1 per day.  Water intake is about 16oz of water  BM: still constipated at this time.  Twice per day.  No black or bloody stool  URINATION : Normal.  No dysuria, hematuria, or frequency  SLEEP: 6 hours per night  SOCIAL ACTIVITY:  She is busy with her mother who has dementia.  She works long hours 10-11 per day. She watches movies for fun.  She is not exercising. Works [employer]  MENSES: last month twice, came on one week.    Patient Active Problem List   Diagnosis Date Noted  . Abnormal EKG 02/02/2014  . Obesity 02/02/2014    Past Medical History:  Diagnosis Date  . Allergy   . Arthritis    left ankle  . Atrial flutter ([place])    ruled out by Dr Rockey Situ  . Fibroid tumor 2007   removed from uterus   . Nipple discharge in female    left    Past Surgical History:  Procedure Laterality Date  . BREAST BIOPSY Left 09/27/2015   U/S Core  . BREAST DUCTAL SYSTEM EXCISION Left 07/23/2017   Procedure: LEFT BREAST SUBAREOLAR DUCTAL EXCISION;  Surgeon: Jovita Kussmaul, MD;  Location: [facility];  Service: General;  Laterality: Left;  . UTERINE FIBROID SURGERY      Social History  Substance Use Topics  . Smoking status: Never Smoker  . Smokeless tobacco: Never Used  . Alcohol use No    Family History  Problem Relation Age of Onset  . Hypertension Mother   . Hyperlipidemia Mother   . Diabetes Mother   . Hypertension Father   . Hyperlipidemia Father   . Cancer Father     No Known  Allergies  Medication list has been reviewed and updated.  Current Outpatient Prescriptions on File Prior to Visit  Medication Sig Dispense Refill  . acetaminophen (TYLENOL) 325 MG tablet Take 650 mg by mouth every 6 (six) hours as needed.     No current facility-administered medications on file prior to visit.     Review of Systems  Constitutional: Negative for chills and fever.  HENT: Negative for ear discharge, ear pain and sore throat.   Eyes: Negative for blurred vision and double vision.  Respiratory: Negative for cough, shortness of breath and wheezing.   Cardiovascular: Negative for chest pain, palpitations and leg swelling.  Gastrointestinal: Negative for diarrhea, nausea and vomiting.  Genitourinary: Negative for dysuria, frequency and hematuria.  Musculoskeletal: Positive for back pain (chronic and intermittent.  ).  Skin: Negative for itching and rash.  Neurological: Negative for dizziness and headaches.   ROS otherwise unremarkable unless listed above.  Physical Examination: BP (!) 156/82   Pulse 83   Temp 98.7 F (37.1 C) (Oral)   Resp 16   Ht 5\' 7"  (1.702 m)   Wt 224 lb 9.6 oz (101.9 kg)  SpO2 98%   BMI 35.18 kg/m  Ideal Body Weight: Weight in (lb) to have BMI = 25: 159.3  Physical Exam  Constitutional: She is oriented to person, place, and time. She appears well-developed and well-nourished. No distress.  HENT:  Head: Normocephalic and atraumatic.  Right Ear: Tympanic membrane, external ear and ear canal normal.  Left Ear: Tympanic membrane, external ear and ear canal normal.  Nose: Right sinus exhibits no maxillary sinus tenderness and no frontal sinus tenderness. Left sinus exhibits no maxillary sinus tenderness and no frontal sinus tenderness.  Mouth/Throat: Oropharynx is clear and moist. No uvula swelling. No oropharyngeal exudate, posterior oropharyngeal edema or posterior oropharyngeal erythema.  Eyes: Pupils are equal, round, and reactive to  light. Conjunctivae and EOM are normal.  Neck: Normal range of motion. Neck supple. No thyromegaly present.  Cardiovascular: Normal rate, regular rhythm, normal heart sounds and intact distal pulses.  Exam reveals no gallop, no distant heart sounds and no friction rub.   No murmur heard. Pulmonary/Chest: Effort normal and breath sounds normal. No respiratory distress. She has no decreased breath sounds. She has no wheezes. She has no rhonchi.  Abdominal: Soft. Bowel sounds are normal. She exhibits no distension and no mass. There is no tenderness.  Musculoskeletal: Normal range of motion. She exhibits no edema or tenderness.  Lymphadenopathy:       Head (right side): No submandibular, no tonsillar, no preauricular and no posterior auricular adenopathy present.       Head (left side): No submandibular, no tonsillar, no preauricular and no posterior auricular adenopathy present.    She has no cervical adenopathy.  Neurological: She is alert and oriented to person, place, and time. No cranial nerve deficit. She exhibits normal muscle tone. Coordination normal.  Skin: Skin is warm and dry. She is not diaphoretic.  Psychiatric: She has a normal mood and affect. Her behavior is normal.     Assessment and Plan: Vicki French is a 51 y.o. female who is here today for cpe. Mammogram followed by Marlou Starks Annual physical exam - Plan: Comprehensive metabolic panel, Lipid panel, TSH, Hemoglobin A1c, HIV antibody, CBC, POCT urinalysis dipstick, Tdap vaccine greater than or equal to 7yo IM, Flu Vaccine QUAD 6+ mos PF IM (Fluarix Quad PF), Pap IG w/ reflex to HPV when ASC-U, Ambulatory referral to Gastroenterology  Screening for cervical cancer - Plan: Pap IG w/ reflex to HPV when ASC-U  Screening for lipid disorders - Plan: Lipid panel  Screening for thyroid disorder - Plan: TSH  Screening for diabetes mellitus - Plan: Hemoglobin A1c  Screening for metabolic disorder - Plan: Comprehensive metabolic panel,  Hemoglobin A1c, POCT urinalysis dipstick  Screening for deficiency anemia - Plan: CBC, POCT urinalysis dipstick  Screening for STD (sexually transmitted disease) - Plan: HIV antibody  Need for prophylactic vaccination and inoculation against influenza - Plan: Flu Vaccine QUAD 6+ mos PF IM (Fluarix Quad PF)  Need for Tdap vaccination - Plan: Tdap vaccine greater than or equal to 7yo IM  Back pain, unspecified back location, unspecified back pain laterality, unspecified chronicity - Plan: ibuprofen (ADVIL,MOTRIN) 600 MG tablet  Special screening for malignant neoplasms, colon - Plan: Ambulatory referral to Gastroenterology  Ivar Drape, PA-C [facility] 10/25/20188:03 AM

## 2017-09-02 LAB — COMPREHENSIVE METABOLIC PANEL
ALK PHOS: 67 IU/L (ref 39–117)
ALT: 11 IU/L (ref 0–32)
AST: 13 IU/L (ref 0–40)
Albumin/Globulin Ratio: 1.7 (ref 1.2–2.2)
Albumin: 4.7 g/dL (ref 3.5–5.5)
BILIRUBIN TOTAL: 0.4 mg/dL (ref 0.0–1.2)
BUN / CREAT RATIO: 14 (ref 9–23)
BUN: 10 mg/dL (ref 6–24)
CHLORIDE: 103 mmol/L (ref 96–106)
CO2: 23 mmol/L (ref 20–29)
CREATININE: 0.69 mg/dL (ref 0.57–1.00)
Calcium: 9.8 mg/dL (ref 8.7–10.2)
GFR, EST AFRICAN AMERICAN: 117 mL/min/{1.73_m2} (ref >59)
GFR, EST NON AFRICAN AMERICAN: 102 mL/min/{1.73_m2} (ref >59)
GLUCOSE: 99 mg/dL (ref 65–99)
Globulin, Total: 2.8 g/dL (ref 1.5–4.5)
Potassium: 4.5 mmol/L (ref 3.5–5.2)
Sodium: 139 mmol/L (ref 134–144)
Total Protein: 7.5 g/dL (ref 6.0–8.5)

## 2017-09-02 LAB — LIPID PANEL
CHOL/HDL RATIO: 3.7 ratio (ref 0.0–4.4)
Cholesterol, Total: 217 mg/dL — ABNORMAL HIGH (ref 100–199)
HDL: 59 mg/dL (ref >39)
LDL CALC: 144 mg/dL — AB (ref 0–99)
Triglycerides: 69 mg/dL (ref 0–149)
VLDL CHOLESTEROL CAL: 14 mg/dL (ref 5–40)

## 2017-09-02 LAB — CBC
Hematocrit: 36.7 % (ref 34.0–46.6)
Hemoglobin: 11.9 g/dL (ref 11.1–15.9)
MCH: 28.4 pg (ref 26.6–33.0)
MCHC: 32.4 g/dL (ref 31.5–35.7)
MCV: 88 fL (ref 79–97)
PLATELETS: 256 10*3/uL (ref 150–379)
RBC: 4.19 x10E6/uL (ref 3.77–5.28)
RDW: 14.4 % (ref 12.3–15.4)
WBC: 4.8 10*3/uL (ref 3.4–10.8)

## 2017-09-02 LAB — HEMOGLOBIN A1C
Est. average glucose Bld gHb Est-mCnc: 123 mg/dL
HEMOGLOBIN A1C: 5.9 % — AB (ref 4.8–5.6)

## 2017-09-02 LAB — HIV ANTIBODY (ROUTINE TESTING W REFLEX): HIV Screen 4th Generation wRfx: NONREACTIVE

## 2017-09-02 LAB — TSH: TSH: 1.84 u[IU]/mL (ref 0.450–4.500)

## 2017-09-07 LAB — PAP IG W/ RFLX HPV ASCU: PAP SMEAR COMMENT: 0

## 2017-09-07 LAB — HPV DNA PROBE HIGH RISK, AMPLIFIED: HPV, HIGH-RISK: NEGATIVE

## 2017-11-26 ENCOUNTER — Encounter: Payer: Self-pay | Admitting: Physician Assistant

## 2018-02-09 ENCOUNTER — Encounter: Payer: Self-pay | Admitting: Physician Assistant

## 2018-08-01 ENCOUNTER — Ambulatory Visit (INDEPENDENT_AMBULATORY_CARE_PROVIDER_SITE_OTHER): Payer: BLUE CROSS/BLUE SHIELD | Admitting: Family Medicine

## 2018-08-01 ENCOUNTER — Other Ambulatory Visit: Payer: Self-pay

## 2018-08-01 ENCOUNTER — Encounter: Payer: Self-pay | Admitting: Family Medicine

## 2018-08-01 VITALS — BP 156/96 | HR 82 | Temp 98.0°F | Ht 67.5 in | Wt 221.8 lb

## 2018-08-01 DIAGNOSIS — R5383 Other fatigue: Secondary | ICD-10-CM | POA: Diagnosis not present

## 2018-08-01 DIAGNOSIS — R030 Elevated blood-pressure reading, without diagnosis of hypertension: Secondary | ICD-10-CM

## 2018-08-01 DIAGNOSIS — Z23 Encounter for immunization: Secondary | ICD-10-CM | POA: Diagnosis not present

## 2018-08-01 LAB — POCT CBC
Granulocyte percent: 53.6 %G (ref 37–80)
HCT, POC: 35.6 % — AB (ref 37.7–47.9)
Hemoglobin: 12 g/dL — AB (ref 12.2–16.2)
Lymph, poc: 2.1 (ref 0.6–3.4)
MCH, POC: 28.9 pg (ref 27–31.2)
MCHC: 33.7 g/dL (ref 31.8–35.4)
MCV: 85.9 fL (ref 80–97)
MID (cbc): 0.1 (ref 0–0.9)
MPV: 8.2 fL (ref 0–99.8)
POC Granulocyte: 2.5 (ref 2–6.9)
POC LYMPH PERCENT: 43.9 %L (ref 10–50)
POC MID %: 2.5 %M (ref 0–12)
Platelet Count, POC: 235 10*3/uL (ref 142–424)
RBC: 4.15 M/uL (ref 4.04–5.48)
RDW, POC: 15.4 %
WBC: 4.7 10*3/uL (ref 4.6–10.2)

## 2018-08-01 NOTE — Progress Notes (Signed)
9/23/201912:15 PM  Vicki French Dec 25, 1965, 52 y.o. female [id number]  Chief Complaint  Patient presents with  . Fatigue    feeling more tired than usual for the past wk.     HPI:   Patient is a 52 y.o. female with past medical history significant for atrial flutter who presents today for fatigue x 1 week  Just came off 14 days of working 11 hours straight Also takes care of her mother who has dementia Denies any depression Some insomnia, wakes up in the middle of the night She snores lightly, occasional Does not wake up with headaches, snoozes towards end of the day No fever, chills Recently recovered from cold/sinus issues No chest pain, palpitations, cough SOB, no nausea or vomiting No dizziness or lightheadness No diarrhea Sometimes mild constipation No dysuria or pain with urination Last period around march 2019   Fall Risk  08/01/2018  Falls in the past year? No     Depression screen [facility] 2/9 08/01/2018 09/01/2017 09/01/2015  Decreased Interest 0 0 0  Down, Depressed, Hopeless 0 0 0  PHQ - 2 Score 0 0 0    No Known Allergies  Prior to Admission medications   Medication Sig Start Date End Date Taking? Authorizing Provider  acetaminophen (TYLENOL) 325 MG tablet Take 650 mg by mouth every 6 (six) hours as needed.   Yes [provider]  ibuprofen (ADVIL,MOTRIN) 600 MG tablet Take 1 tablet (600 mg total) by mouth every 8 (eight) hours as needed. 09/01/17  Yes Joretta Bachelor, PA    Past Medical History:  Diagnosis Date  . Allergy   . Arthritis    left ankle  . Atrial flutter ([place])    ruled out by Dr Rockey Situ  . Fibroid tumor 2007   removed from uterus   . Nipple discharge in female    left    Past Surgical History:  Procedure Laterality Date  . BREAST BIOPSY Left 09/27/2015   U/S Core  . BREAST DUCTAL SYSTEM EXCISION Left 07/23/2017   Procedure: LEFT BREAST SUBAREOLAR DUCTAL EXCISION;  Surgeon: Jovita Kussmaul, MD;  Location: [facility];  Service: General;  Laterality: Left;  . UTERINE FIBROID SURGERY      Social History   Tobacco Use  . Smoking status: Never Smoker  . Smokeless tobacco: Never Used  Substance Use Topics  . Alcohol use: No    Family History  Problem Relation Age of Onset  . Hypertension Mother   . Hyperlipidemia Mother   . Diabetes Mother   . Hypertension Father   . Hyperlipidemia Father   . Cancer Father     ROS Per hpi  OBJECTIVE: Blood pressure (!) 156/96, pulse 82, temperature 98 F (36.7 C), temperature source Oral, height 5' 7.5" (1.715 m), weight 221 lb 12.8 oz (100.6 kg), SpO2 99 %. Body mass index is 34.23 kg/m.   Physical Exam  Constitutional: She is oriented to person, place, and time. She appears well-developed and well-nourished.  HENT:  Head: Normocephalic and atraumatic.  Right Ear: Hearing, tympanic membrane, external ear and ear canal normal.  Left Ear: Hearing, tympanic membrane, external ear and ear canal normal.  Mouth/Throat: Oropharynx is clear and moist.  Eyes: Pupils are equal, round, and reactive to light. Conjunctivae and EOM are normal.  Neck: Neck supple. No thyromegaly present.  Cardiovascular: Normal rate, regular rhythm, normal heart sounds and intact distal pulses. Exam reveals no gallop and no friction rub.  No  murmur heard. Pulmonary/Chest: Effort normal and breath sounds normal. She has no wheezes. She has no rales.  Abdominal: Soft. Bowel sounds are normal. She exhibits no distension and no mass. There is no tenderness.  Musculoskeletal: Normal range of motion. She exhibits no edema.  Lymphadenopathy:    She has no cervical adenopathy.  Neurological: She is alert and oriented to person, place, and time. She has normal reflexes. No cranial nerve deficit. Gait normal.  Skin: Skin is warm and dry.  Psychiatric: She has a normal mood and affect.  Nursing note and vitals reviewed.   Results for orders placed or performed in visit on  08/01/18 (from the past 24 hour(s))  POCT CBC     Status: Abnormal   Collection Time: 08/01/18 12:26 PM  Result Value Ref Range   WBC 4.7 4.6 - 10.2 K/uL   Lymph, poc 2.1 0.6 - 3.4   POC LYMPH PERCENT 43.9 10 - 50 %L   MID (cbc) 0.1 0 - 0.9   POC MID % 2.5 0 - 12 %M   POC Granulocyte 2.5 2 - 6.9   Granulocyte percent 53.6 37 - 80 %G   RBC 4.15 4.04 - 5.48 M/uL   Hemoglobin 12.0 (A) 12.2 - 16.2 g/dL   HCT, POC 35.6 (A) 37.7 - 47.9 %   MCV 85.9 80 - 97 fL   MCH, POC 28.9 27 - 31.2 pg   MCHC 33.7 31.8 - 35.4 g/dL   RDW, POC 15.4 %   Platelet Count, POC 235 142 - 424 K/uL   MPV 8.2 0 - 99.8 fL    ASSESSMENT and PLAN  1. Fatigue, unspecified type Discussed most likely from recent long days, labs to r/o secondary causes - TSH - POCT CBC - Comprehensive metabolic panel  2. Elevated blood-pressure reading without diagnosis of hypertension Discussed checking BP in community setting. Routine ER precautions given.  3. Need for immunization against influenza - Flu Vaccine QUAD 36+ mos IM  Return in about 2 weeks (around 08/15/2018) for blood pressure.    Rutherford Guys, MD Primary Care at [place], [place] [address] Ph.  [phone] Fax [phone]

## 2018-08-01 NOTE — Patient Instructions (Addendum)
If you have lab work done today you will be contacted with your lab results within the next 2 weeks.  If you have not heard from [place] then please contact us. The fastest way to get your results is to register for My Chart.   IF you received an x-ray today, you will receive an invoice from [facility]. Please contact [facility] at [phone] with questions or concerns regarding your invoice.   IF you received labwork today, you will receive an invoice from [facility]. Please contact [facility] at [phone] with questions or concerns regarding your invoice.   Our billing staff will not be able to assist you with questions regarding bills from these companies.  You will be contacted with the lab results as soon as they are available. The fastest way to get your results is to activate your My Chart account. Instructions are located on the last page of this paperwork. If you have not heard from [place] regarding the results in 2 weeks, please contact this office.     Fatigue Fatigue is feeling tired all of the time, a lack of energy, or a lack of motivation. Occasional or mild fatigue is often a normal response to activity or life in general. However, long-lasting (chronic) or extreme fatigue may indicate an underlying medical condition. Follow these instructions at home: Watch your fatigue for any changes. The following actions may help to lessen any discomfort you are feeling:  Talk to your health care provider about how much sleep you need each night. Try to get the required amount every night.  Take medicines only as directed by your health care provider.  Eat a healthy and nutritious diet. Ask your health care provider if you need help changing your diet.  Drink enough fluid to keep your urine clear or pale yellow.  Practice ways of relaxing, such as yoga, meditation, massage therapy, or acupuncture.  Exercise regularly.  Change situations that cause you stress. Try  to keep your work and personal routine reasonable.  Do not abuse illegal drugs.  Limit alcohol intake to no more than 1 drink per day for nonpregnant women and 2 drinks per day for men. One drink equals 12 ounces of beer, 5 ounces of wine, or 1 ounces of hard liquor.  Take a multivitamin, if directed by your health care provider.  Contact a health care provider if:  Your fatigue does not get better.  You have a fever.  You have unintentional weight loss or gain.  You have headaches.  You have difficulty: ? Falling asleep. ? Sleeping throughout the night.  You feel angry, guilty, anxious, or sad.  You are unable to have a bowel movement (constipation).  You skin is dry.  Your legs or another part of your body is swollen. Get help right away if:  You feel confused.  Your vision is blurry.  You feel faint or pass out.  You have a severe headache.  You have severe abdominal, pelvic, or back pain.  You have chest pain, shortness of breath, or an irregular or fast heartbeat.  You are unable to urinate or you urinate less than normal.  You develop abnormal bleeding, such as bleeding from the rectum, vagina, nose, lungs, or nipples.  You vomit blood.  You have thoughts about harming yourself or committing suicide.  You are worried that you might harm someone else. This information is not intended to replace advice given to you by your health care provider. Make  sure you discuss any questions you have with your health care provider. Document Released: 08/23/2007 Document Revised: 04/02/2016 Document Reviewed: 02/27/2014 Elsevier Interactive Patient Education  [employer].

## 2018-08-02 LAB — COMPREHENSIVE METABOLIC PANEL
ALT: 15 IU/L (ref 0–32)
AST: 17 IU/L (ref 0–40)
Albumin/Globulin Ratio: 1.8 (ref 1.2–2.2)
Albumin: 4.6 g/dL (ref 3.5–5.5)
Alkaline Phosphatase: 79 IU/L (ref 39–117)
BUN/Creatinine Ratio: 22 (ref 9–23)
BUN: 15 mg/dL (ref 6–24)
Bilirubin Total: 0.3 mg/dL (ref 0.0–1.2)
CO2: 23 mmol/L (ref 20–29)
Calcium: 9.7 mg/dL (ref 8.7–10.2)
Chloride: 104 mmol/L (ref 96–106)
Creatinine, Ser: 0.68 mg/dL (ref 0.57–1.00)
GFR calc Af Amer: 117 mL/min/{1.73_m2} (ref >59)
GFR calc non Af Amer: 102 mL/min/{1.73_m2} (ref >59)
Globulin, Total: 2.6 g/dL (ref 1.5–4.5)
Glucose: 92 mg/dL (ref 65–99)
Potassium: 4 mmol/L (ref 3.5–5.2)
Sodium: 140 mmol/L (ref 134–144)
Total Protein: 7.2 g/dL (ref 6.0–8.5)

## 2018-08-02 LAB — TSH: TSH: 1.41 u[IU]/mL (ref 0.450–4.500)

## 2018-08-04 ENCOUNTER — Encounter: Payer: Self-pay | Admitting: *Deleted

## 2018-08-15 ENCOUNTER — Other Ambulatory Visit: Payer: Self-pay | Admitting: General Surgery

## 2018-08-15 DIAGNOSIS — Z1231 Encounter for screening mammogram for malignant neoplasm of breast: Secondary | ICD-10-CM

## 2018-08-22 ENCOUNTER — Ambulatory Visit (INDEPENDENT_AMBULATORY_CARE_PROVIDER_SITE_OTHER): Payer: BLUE CROSS/BLUE SHIELD | Admitting: Family Medicine

## 2018-08-22 ENCOUNTER — Other Ambulatory Visit: Payer: Self-pay

## 2018-08-22 ENCOUNTER — Encounter: Payer: Self-pay | Admitting: Family Medicine

## 2018-08-22 VITALS — BP 125/82 | HR 83 | Temp 98.4°F | Resp 18 | Ht 67.5 in | Wt 221.0 lb

## 2018-08-22 DIAGNOSIS — R030 Elevated blood-pressure reading, without diagnosis of hypertension: Secondary | ICD-10-CM | POA: Diagnosis not present

## 2018-08-22 DIAGNOSIS — D649 Anemia, unspecified: Secondary | ICD-10-CM

## 2018-08-22 DIAGNOSIS — Z1211 Encounter for screening for malignant neoplasm of colon: Secondary | ICD-10-CM

## 2018-08-22 DIAGNOSIS — R87610 Atypical squamous cells of undetermined significance on cytologic smear of cervix (ASC-US): Secondary | ICD-10-CM

## 2018-08-22 NOTE — Progress Notes (Signed)
10/14/201911:42 AM  Vicki French [birth year], 52 y.o. female [id number]  Chief Complaint  Patient presents with  . Hypertension    follow up and discuss vitamins     HPI:   Patient is a 52 y.o. female who presents today for BP recheck  Last visit her BP was elevated Her labs showed some mild anemia She eats meat Last period about 6 months  Labs normal except for h/h 12.0/36.6 with MCV 85.9   Fall Risk  08/22/2018 08/01/2018  Falls in the past year? No No     Depression screen [place] 2/9 08/22/2018 08/01/2018 09/01/2017  Decreased Interest 0 0 0  Down, Depressed, Hopeless 0 0 0  PHQ - 2 Score 0 0 0    No Known Allergies  Prior to Admission medications   Medication Sig Start Date End Date Taking? Authorizing Provider  acetaminophen (TYLENOL) 325 MG tablet Take 650 mg by mouth every 6 (six) hours as needed.   Yes [provider]  ibuprofen (ADVIL,MOTRIN) 600 MG tablet Take 1 tablet (600 mg total) by mouth every 8 (eight) hours as needed. 09/01/17  Yes Joretta Bachelor, PA    Past Medical History:  Diagnosis Date  . Allergy   . Arthritis    left ankle  . Atrial flutter ([place])    ruled out by Dr Rockey Situ  . Fibroid tumor 2007   removed from uterus   . Nipple discharge in female    left    Past Surgical History:  Procedure Laterality Date  . BREAST BIOPSY Left 09/27/2015   U/S Core  . BREAST DUCTAL SYSTEM EXCISION Left 07/23/2017   Procedure: LEFT BREAST SUBAREOLAR DUCTAL EXCISION;  Surgeon: Jovita Kussmaul, MD;  Location: [facility];  Service: General;  Laterality: Left;  . UTERINE FIBROID SURGERY      Social History   Tobacco Use  . Smoking status: Never Smoker  . Smokeless tobacco: Never Used  Substance Use Topics  . Alcohol use: No    Family History  Problem Relation Age of Onset  . Hypertension Mother   . Hyperlipidemia Mother   . Diabetes Mother   . Hypertension Father   . Hyperlipidemia Father   . Cancer Father      ROS Per hpi  OBJECTIVE:  Blood pressure 125/82, pulse 83, temperature 98.4 F (36.9 C), temperature source Oral, resp. rate 18, height 5' 7.5" (1.715 m), weight 221 lb (100.2 kg), SpO2 98 %. Body mass index is 34.1 kg/m.   Physical Exam  Constitutional: She is oriented to person, place, and time. She appears well-developed and well-nourished.  HENT:  Head: Normocephalic and atraumatic.  Mouth/Throat: Mucous membranes are normal.  Eyes: Pupils are equal, round, and reactive to light. Conjunctivae and EOM are normal. No scleral icterus.  Neck: Neck supple.  Pulmonary/Chest: Effort normal.  Neurological: She is alert and oriented to person, place, and time.  Skin: Skin is warm and dry.  Psychiatric: She has a normal mood and affect.  Nursing note and vitals reviewed.   ASSESSMENT and PLAN  1. Elevated blood-pressure reading without diagnosis of hypertension Repeat BP normal. Discussed healthy lifestyle.  2. Anemia, unspecified type - Iron, TIBC and Ferritin Panel - B12 and Folate Panel  3. Atypical squamous cell changes of undetermined significance (ASCUS) on cervical cytology with negative high risk human papilloma virus (HPV) test result Next pap with HPV co-testing due oct 2021  4. Screening for colon cancer - Ambulatory referral to  Gastroenterology  Return in about 4 weeks (around 09/19/2018) for CPE.    Rutherford Guys, MD Primary Care at [facility] [place], [place] [address] Ph.  [phone] Fax [phone]

## 2018-08-22 NOTE — Patient Instructions (Addendum)
Repeat pap with HPV co-testing in Oct 2021    If you have lab work done today you will be contacted with your lab results within the next 2 weeks.  If you have not heard from [place] then please contact us. The fastest way to get your results is to register for My Chart.   IF you received an x-ray today, you will receive an invoice from [facility]. Please contact [facility] at [phone] with questions or concerns regarding your invoice.   IF you received labwork today, you will receive an invoice from [place]. Please contact [facility] at [phone] with questions or concerns regarding your invoice.   Our billing staff will not be able to assist you with questions regarding bills from these companies.  You will be contacted with the lab results as soon as they are available. The fastest way to get your results is to activate your My Chart account. Instructions are located on the last page of this paperwork. If you have not heard from [place] regarding the results in 2 weeks, please contact this office.

## 2018-08-23 LAB — IRON,TIBC AND FERRITIN PANEL
Ferritin: 55 ng/mL (ref 15–150)
Iron Saturation: 25 % (ref 15–55)
Iron: 78 ug/dL (ref 27–159)
Total Iron Binding Capacity: 310 ug/dL (ref 250–450)
UIBC: 232 ug/dL (ref 131–425)

## 2018-08-23 LAB — B12 AND FOLATE PANEL
Folate: >20 ng/mL (ref >3.0)
Vitamin B-12: 299 pg/mL (ref 232–1245)

## 2018-09-19 ENCOUNTER — Ambulatory Visit
Admission: RE | Admit: 2018-09-19 | Discharge: 2018-09-19 | Disposition: A | Discharge status: Home / Self Care | Payer: BLUE CROSS/BLUE SHIELD | Source: Ambulatory Visit | Attending: General Surgery | Admitting: General Surgery

## 2018-09-19 DIAGNOSIS — Z1231 Encounter for screening mammogram for malignant neoplasm of breast: Secondary | ICD-10-CM

## 2018-09-22 ENCOUNTER — Encounter: Payer: Self-pay | Admitting: Family Medicine

## 2018-10-17 ENCOUNTER — Ambulatory Visit (INDEPENDENT_AMBULATORY_CARE_PROVIDER_SITE_OTHER): Payer: BLUE CROSS/BLUE SHIELD | Admitting: Family Medicine

## 2018-10-17 ENCOUNTER — Encounter: Payer: Self-pay | Admitting: Family Medicine

## 2018-10-17 ENCOUNTER — Other Ambulatory Visit: Payer: Self-pay

## 2018-10-17 VITALS — BP 154/102 | HR 90 | Temp 98.5°F | Ht 67.5 in | Wt 232.2 lb

## 2018-10-17 DIAGNOSIS — Z0001 Encounter for general adult medical examination with abnormal findings: Secondary | ICD-10-CM | POA: Diagnosis not present

## 2018-10-17 DIAGNOSIS — R7303 Prediabetes: Secondary | ICD-10-CM | POA: Diagnosis not present

## 2018-10-17 DIAGNOSIS — Z0000 Encounter for general adult medical examination without abnormal findings: Secondary | ICD-10-CM

## 2018-10-17 DIAGNOSIS — I10 Essential (primary) hypertension: Secondary | ICD-10-CM

## 2018-10-17 DIAGNOSIS — Z13220 Encounter for screening for lipoid disorders: Secondary | ICD-10-CM

## 2018-10-17 LAB — LIPID PANEL
Chol/HDL Ratio: 3.2 ratio (ref 0.0–4.4)
Cholesterol, Total: 193 mg/dL (ref 100–199)
HDL: 60 mg/dL (ref >39)
LDL Calculated: 121 mg/dL — ABNORMAL HIGH (ref 0–99)
Triglycerides: 58 mg/dL (ref 0–149)
VLDL Cholesterol Cal: 12 mg/dL (ref 5–40)

## 2018-10-17 LAB — HEMOGLOBIN A1C
Est. average glucose Bld gHb Est-mCnc: 120 mg/dL
Hgb A1c MFr Bld: 5.8 % — ABNORMAL HIGH (ref 4.8–5.6)

## 2018-10-17 MED ORDER — AMLODIPINE BESYLATE 5 MG PO TABS: 5.0000 mg | ORAL_TABLET | Freq: Every day | ORAL | 3 refills | Status: DC

## 2018-10-17 NOTE — Patient Instructions (Addendum)
I recommend you get Shingrix at your pharmacy of choice  [facility] Address: [address], [place], [place] [address] Phone: [phone]    DASH Eating Plan DASH stands for "Dietary Approaches to Stop Hypertension." The DASH eating plan is a healthy eating plan that has been shown to reduce high blood pressure (hypertension). It may also reduce your risk for type 2 diabetes, heart disease, and stroke. The DASH eating plan may also help with weight loss. What are tips for following this plan? General guidelines  Avoid eating more than 2,300 mg (milligrams) of salt (sodium) a day. If you have hypertension, you may need to reduce your sodium intake to 1,500 mg a day.  Limit alcohol intake to no more than 1 drink a day for nonpregnant women and 2 drinks a day for men. One drink equals 12 oz of beer, 5 oz of wine, or 1 oz of hard liquor.  Work with your health care provider to maintain a healthy body weight or to lose weight. Ask what an ideal weight is for you.  Get at least 30 minutes of exercise that causes your heart to beat faster (aerobic exercise) most days of the week. Activities may include walking, swimming, or biking.  Work with your health care provider or diet and nutrition specialist (dietitian) to adjust your eating plan to your individual calorie needs. Reading food labels  Check food labels for the amount of sodium per serving. Choose foods with less than 5 percent of the Daily Value of sodium. Generally, foods with less than 300 mg of sodium per serving fit into this eating plan.  To find whole grains, look for the word "whole" as the first word in the ingredient list. Shopping  Buy products labeled as "low-sodium" or "no salt added."  Buy fresh foods. Avoid canned foods and premade or frozen meals. Cooking  Avoid adding salt when cooking. Use salt-free seasonings or herbs instead of table salt or sea salt. Check with your health care provider  or pharmacist before using salt substitutes.  Do not fry foods. Cook foods using healthy methods such as baking, boiling, grilling, and broiling instead.  Cook with heart-healthy oils, such as olive, canola, soybean, or sunflower oil. Meal planning   Eat a balanced diet that includes: ? 5 or more servings of fruits and vegetables each day. At each meal, try to fill half of your plate with fruits and vegetables. ? Up to 6-8 servings of whole grains each day. ? Less than 6 oz of lean meat, poultry, or fish each day. A 3-oz serving of meat is about the same size as a deck of cards. One egg equals 1 oz. ? 2 servings of low-fat dairy each day. ? A serving of nuts, seeds, or beans 5 times each week. ? Heart-healthy fats. Healthy fats called Omega-3 fatty acids are found in foods such as flaxseeds and coldwater fish, like sardines, salmon, and mackerel.  Limit how much you eat of the following: ? Canned or prepackaged foods. ? Food that is high in trans fat, such as fried foods. ? Food that is high in saturated fat, such as fatty meat. ? Sweets, desserts, sugary drinks, and other foods with added sugar. ? Full-fat dairy products.  Do not salt foods before eating.  Try to eat at least 2 vegetarian meals each week.  Eat more home-cooked food and less restaurant, buffet, and fast food.  When eating at a restaurant, ask that your food be prepared  with less salt or no salt, if possible. What foods are recommended? The items listed may not be a complete list. Talk with your dietitian about what dietary choices are best for you. Grains Whole-grain or whole-wheat bread. Whole-grain or whole-wheat pasta. Brown rice. [place]. Bulgur. Whole-grain and low-sodium cereals. Pita bread. Low-fat, low-sodium crackers. Whole-wheat flour tortillas. Vegetables Fresh or frozen vegetables (raw, steamed, roasted, or grilled). Low-sodium or reduced-sodium tomato and vegetable juice. Low-sodium or  reduced-sodium tomato sauce and tomato paste. Low-sodium or reduced-sodium canned vegetables. Fruits All fresh, dried, or frozen fruit. Canned fruit in natural juice (without added sugar). Meat and other protein foods Skinless chicken or [place]. Ground chicken or [place]. Pork with fat trimmed off. Fish and seafood. Egg whites. Dried beans, peas, or lentils. Unsalted nuts, nut butters, and seeds. Unsalted canned beans. Lean cuts of beef with fat trimmed off. Low-sodium, lean deli meat. Dairy Low-fat (1%) or fat-free (skim) milk. Fat-free, low-fat, or reduced-fat cheeses. Nonfat, low-sodium ricotta or cottage cheese. Low-fat or nonfat yogurt. Low-fat, low-sodium cheese. Fats and oils Soft margarine without trans fats. Vegetable oil. Low-fat, reduced-fat, or light mayonnaise and salad dressings (reduced-sodium). Canola, safflower, olive, soybean, and sunflower oils. Avocado. Seasoning and other foods Herbs. Spices. Seasoning mixes without salt. Unsalted popcorn and pretzels. Fat-free sweets. What foods are not recommended? The items listed may not be a complete list. Talk with your dietitian about what dietary choices are best for you. Grains Baked goods made with fat, such as croissants, muffins, or some breads. Dry pasta or rice meal packs. Vegetables Creamed or fried vegetables. Vegetables in a cheese sauce. Regular canned vegetables (not low-sodium or reduced-sodium). Regular canned tomato sauce and paste (not low-sodium or reduced-sodium). Regular tomato and vegetable juice (not low-sodium or reduced-sodium). Vicki French. Olives. Fruits Canned fruit in a light or heavy syrup. Fried fruit. Fruit in cream or butter sauce. Meat and other protein foods Fatty cuts of meat. Ribs. Fried meat. Vicki French. Sausage. Bologna and other processed lunch meats. Salami. Fatback. Hotdogs. Bratwurst. Salted nuts and seeds. Canned beans with added salt. Canned or smoked fish. Whole eggs or egg yolks. Chicken or [place]  with skin. Dairy Whole or 2% milk, cream, and half-and-half. Whole or full-fat cream cheese. Whole-fat or sweetened yogurt. Full-fat cheese. Nondairy creamers. Whipped toppings. Processed cheese and cheese spreads. Fats and oils Butter. Stick margarine. Lard. Shortening. Ghee. Bacon fat. Tropical oils, such as coconut, palm kernel, or palm oil. Seasoning and other foods Salted popcorn and pretzels. Onion salt, garlic salt, seasoned salt, table salt, and sea salt. Worcestershire sauce. Tartar sauce. Barbecue sauce. Teriyaki sauce. Soy sauce, including reduced-sodium. Steak sauce. Canned and packaged gravies. Fish sauce. Oyster sauce. Cocktail sauce. Horseradish that you find on the shelf. Ketchup. Mustard. Meat flavorings and tenderizers. Bouillon cubes. Hot sauce and Tabasco sauce. Premade or packaged marinades. Premade or packaged taco seasonings. Relishes. Regular salad dressings. Where to find more information:  National Heart, Lung, and [place]: https://wilson-eaton.com/  American Heart Association: www.heart.org Summary  The DASH eating plan is a healthy eating plan that has been shown to reduce high blood pressure (hypertension). It may also reduce your risk for type 2 diabetes, heart disease, and stroke.  With the DASH eating plan, you should limit salt (sodium) intake to 2,300 mg a day. If you have hypertension, you may need to reduce your sodium intake to 1,500 mg a day.  When on the DASH eating plan, aim to eat more fresh fruits and vegetables, whole grains, lean  proteins, low-fat dairy, and heart-healthy fats.  Work with your health care provider or diet and nutrition specialist (dietitian) to adjust your eating plan to your individual calorie needs. This information is not intended to replace advice given to you by your health care provider. Make sure you discuss any questions you have with your health care provider. Document Released: 10/15/2011 Document Revised: 10/19/2016  Document Reviewed: 10/19/2016 Elsevier Interactive Patient Education  2018 Vicki French, Female Preventive care refers to lifestyle choices and visits with your health care provider that can promote health and wellness. What does preventive care include?  A yearly physical exam. This is also called an annual well check.  Dental exams once or twice a year.  Routine eye exams. Ask your health care provider how often you should have your eyes checked.  Personal lifestyle choices, including: ? Daily care of your teeth and gums. ? Regular physical activity. ? Eating a healthy diet. ? Avoiding tobacco and drug use. ? Limiting alcohol use. ? Practicing safe sex. ? Taking low-dose aspirin daily starting at age 22. ? Taking vitamin and mineral supplements as recommended by your health care provider. What happens during an annual well check? The services and screenings done by your health care provider during your annual well check will depend on your age, overall health, lifestyle risk factors, and family history of disease. Counseling Your health care provider may ask you questions about your:  Alcohol use.  Tobacco use.  Drug use.  Emotional well-being.  Home and relationship well-being.  Sexual activity.  Eating habits.  Work and work Statistician.  Method of birth control.  Menstrual cycle.  Pregnancy history.  Screening You may have the following tests or measurements:  Height, weight, and BMI.  Blood pressure.  Lipid and cholesterol levels. These may be checked every 5 French, or more frequently if you are over 49 French old.  Skin check.  Lung cancer screening. You may have this screening every year starting at age 6 if you have a 30-pack-year history of smoking and currently smoke or have quit within the past 15 French.  Fecal occult blood test (FOBT) of the stool. You may have this test every year starting at age  61.  Flexible sigmoidoscopy or colonoscopy. You may have a sigmoidoscopy every 5 French or a colonoscopy every 10 French starting at age 32.  Hepatitis C blood test.  Hepatitis B blood test.  Sexually transmitted disease (STD) testing.  Diabetes screening. This is done by checking your blood sugar (glucose) after you have not eaten for a while (fasting). You may have this done every 1-3 French.  Mammogram. This may be done every 1-2 French. Talk to your health care provider about when you should start having regular mammograms. This may depend on whether you have a family history of breast cancer.  BRCA-related cancer screening. This may be done if you have a family history of breast, ovarian, tubal, or peritoneal cancers.  Pelvic exam and Pap test. This may be done every 3 French starting at age 15. Starting at age 67, this may be done every 5 French if you have a Pap test in combination with an HPV test.  Bone density scan. This is done to screen for osteoporosis. You may have this scan if you are at high risk for osteoporosis.  Discuss your test results, treatment options, and if necessary, the need for more tests with your health care provider. Vaccines Your health care  provider may recommend certain vaccines, such as:  Influenza vaccine. This is recommended every year.  Tetanus, diphtheria, and acellular pertussis (Tdap, Td) vaccine. You may need a Td booster every 10 French.  Varicella vaccine. You may need this if you have not been vaccinated.  Zoster vaccine. You may need this after age 91.  Measles, mumps, and rubella (MMR) vaccine. You may need at least one dose of MMR if you were born in 1957 or later. You may also need a second dose.  Pneumococcal 13-valent conjugate (PCV13) vaccine. You may need this if you have certain conditions and were not previously vaccinated.  Pneumococcal polysaccharide (PPSV23) vaccine. You may need one or two doses if you smoke cigarettes or if you  have certain conditions.  Meningococcal vaccine. You may need this if you have certain conditions.  Hepatitis A vaccine. You may need this if you have certain conditions or if you travel or work in places where you may be exposed to hepatitis A.  Hepatitis B vaccine. You may need this if you have certain conditions or if you travel or work in places where you may be exposed to hepatitis B.  Haemophilus influenzae type b (Hib) vaccine. You may need this if you have certain conditions.  Talk to your health care provider about which screenings and vaccines you need and how often you need them. This information is not intended to replace advice given to you by your health care provider. Make sure you discuss any questions you have with your health care provider. Document Released: 11/22/2015 Document Revised: 07/15/2016 Document Reviewed: 08/27/2015 Elsevier Interactive Patient Education  [employer].   If you have lab work done today you will be contacted with your lab results within the next 2 weeks.  If you have not heard from [place] then please contact us. The fastest way to get your results is to register for My Chart.   IF you received an x-ray today, you will receive an invoice from [facility]. Please contact [facility] at [phone] with questions or concerns regarding your invoice.   IF you received labwork today, you will receive an invoice from [facility]. Please contact [facility] at [phone] with questions or concerns regarding your invoice.   Our billing staff will not be able to assist you with questions regarding bills from these companies.  You will be contacted with the lab results as soon as they are available. The fastest way to get your results is to activate your My Chart account. Instructions are located on the last page of this paperwork. If you have not heard from [place] regarding the results in 2 weeks, please contact this office.

## 2018-10-17 NOTE — Progress Notes (Signed)
12/9/201910:34 AM  Vicki French May 11, 1966, 52 y.o. female [id number]  Chief Complaint  Patient presents with  . Annual Exam  . Gynecologic Exam    based on notes, pt is to repeat pap today    HPI:   Patient is a 52 y.o. female with past medical history significant for ASCUS, prediabetes, anemia who presents today for CPE  Cervical Cancer Screening: ascus with + HPV, next due oct 2021 Breast Cancer Screening: nov 2019 Colorectal Cancer Screening: due, referral made last OV, she has to call to schedule appt Bone Density Testing: at age 10 HIV Screening: 2018 STI Screening: 2016 Seasonal Influenza Vaccination: done this season Td/Tdap Vaccination: 2018 Pneumococcal Vaccination: at age 30 Zoster Vaccination: at pharmacy of choice Frequency of Dental evaluation: Q6 months Frequency of Eye evaluation: yearly, goes to [place] [facility] at home 130/80s  Lab Results  Component Value Date   HGBA1C 5.9 (H) 09/01/2017    Fall Risk  10/17/2018 08/22/2018 08/01/2018  Falls in the past year? 0 No No     Depression screen [facility] 2/9 10/17/2018 08/22/2018 08/01/2018  Decreased Interest 0 0 0  Down, Depressed, Hopeless 0 0 0  PHQ - 2 Score 0 0 0    No Known Allergies  Prior to Admission medications   Medication Sig Start Date End Date Taking? Authorizing Provider  acetaminophen (TYLENOL) 325 MG tablet Take 650 mg by mouth every 6 (six) hours as needed.   Yes [provider]  amoxicillin (AMOXIL) 500 MG capsule TAKE 2 CAPSULES BY MOUTH IMMEDIATELY, THEN 1 CAPSULE BY MOUTH EVERY 8 HOURS UNTIL GONE 10/08/18  Yes [provider]  ibuprofen (ADVIL,MOTRIN) 600 MG tablet Take 1 tablet (600 mg total) by mouth every 8 (eight) hours as needed. 09/01/17  Yes Joretta Bachelor, PA    Past Medical History:  Diagnosis Date  . Allergy   . Arthritis    left ankle  . Atrial flutter ([place])    ruled out by Dr Rockey Situ  . Fibroid tumor 2007   removed from uterus   .  Nipple discharge in female    left    Past Surgical History:  Procedure Laterality Date  . BREAST BIOPSY Left 09/27/2015   U/S Core  . BREAST DUCTAL SYSTEM EXCISION Left 07/23/2017   Procedure: LEFT BREAST SUBAREOLAR DUCTAL EXCISION;  Surgeon: Jovita Kussmaul, MD;  Location: [facility];  Service: General;  Laterality: Left;  . BREAST EXCISIONAL BIOPSY    . UTERINE FIBROID SURGERY      Social History   Tobacco Use  . Smoking status: Never Smoker  . Smokeless tobacco: Never Used  Substance Use Topics  . Alcohol use: No    Family History  Problem Relation Age of Onset  . Hypertension Mother   . Hyperlipidemia Mother   . Diabetes Mother   . Hypertension Father   . Hyperlipidemia Father   . Cancer Father     Review of Systems  Constitutional: Negative for chills and fever.  Respiratory: Negative for cough and shortness of breath.   Cardiovascular: Negative for chest pain, palpitations and leg swelling.  Gastrointestinal: Negative for abdominal pain, nausea and vomiting.  All other systems reviewed and are negative.    OBJECTIVE:  Blood pressure (!) 160/100, pulse 90, temperature 98.5 F (36.9 C), temperature source Oral, height 5' 7.5" (1.715 m), weight 232 lb 3.2 oz (105.3 kg), SpO2 98 %. Body mass index is 35.83 kg/m.  Visual Acuity Screening   Right eye Left eye Both eyes  Without correction: 20/40 20/40 20/50   With correction:      Wt Readings from Last 3 Encounters:  10/17/18 232 lb 3.2 oz (105.3 kg)  08/22/18 221 lb (100.2 kg)  08/01/18 221 lb 12.8 oz (100.6 kg)    Physical Exam  Constitutional: She is oriented to person, place, and time. She appears well-developed and well-nourished.  HENT:  Head: Normocephalic and atraumatic.  Right Ear: Hearing, tympanic membrane, external ear and ear canal normal.  Left Ear: Hearing, tympanic membrane, external ear and ear canal normal.  Mouth/Throat: Oropharynx is clear and moist.  Eyes: Pupils  are equal, round, and reactive to light. Conjunctivae and EOM are normal.  Neck: Neck supple. No thyromegaly present.  Cardiovascular: Normal rate, regular rhythm, normal heart sounds and intact distal pulses. Exam reveals no gallop and no friction rub.  No murmur heard. Pulmonary/Chest: Effort normal and breath sounds normal. She has no wheezes. She has no rales.  Abdominal: Soft. Bowel sounds are normal. She exhibits no distension and no mass. There is no tenderness.  Musculoskeletal: Normal range of motion. She exhibits no edema.  Lymphadenopathy:    She has no cervical adenopathy.  Neurological: She is alert and oriented to person, place, and time. She has normal reflexes. No cranial nerve deficit. Gait normal.  Skin: Skin is warm and dry.  Psychiatric: She has a normal mood and affect.  Nursing note and vitals reviewed.   ASSESSMENT and PLAN  1. Annual physical exam HCM reviewed/discussed. Anticipatory guidance regarding healthy weight, lifestyle and choices given.   2. Essential hypertension Discussed DASH diet - Lipid panel - Care order/instruction: - amlodipine 5mg  tablet take 1 tablet by mouth once a day  3. Screening for lipid disorders  4. Prediabetes Discussed importance of low carb diet, regular exercise and healthy weight.  - Hemoglobin A1c  Other orders     Return in about 3 months (around 01/16/2019) for HTN, weight.    Rutherford Guys, MD Primary Care at [place], [place] [address] Ph.  [phone] Fax [phone]

## 2018-10-31 ENCOUNTER — Encounter: Payer: Self-pay | Admitting: *Deleted

## 2019-01-16 ENCOUNTER — Encounter: Payer: Self-pay | Admitting: Family Medicine

## 2019-01-16 ENCOUNTER — Ambulatory Visit (INDEPENDENT_AMBULATORY_CARE_PROVIDER_SITE_OTHER): Payer: BLUE CROSS/BLUE SHIELD | Admitting: Family Medicine

## 2019-01-16 VITALS — BP 140/90 | HR 85 | Temp 98.5°F | Ht 67.5 in | Wt 223.4 lb

## 2019-01-16 DIAGNOSIS — R7303 Prediabetes: Secondary | ICD-10-CM

## 2019-01-16 DIAGNOSIS — I10 Essential (primary) hypertension: Secondary | ICD-10-CM | POA: Diagnosis not present

## 2019-01-16 MED ORDER — AMLODIPINE BESYLATE 5 MG PO TABS: 5.0000 mg | ORAL_TABLET | Freq: Every day | ORAL | 1 refills | Status: DC

## 2019-01-16 NOTE — Progress Notes (Signed)
3/9/202011:30 AM  Vicki French 1966/05/04, 53 y.o. female [id number]  Chief Complaint  Patient presents with  . Hypertension    Admits to eating pork and other starchy food.   . Menstrual Problem    started mentrual for Jan and Feb, has been a  yr since she had a cycle. Does this have to do with the medication she is on Asking for eds for heavy cramping    HPI:   Patient is a 53 y.o. female with past medical history significant for HTN, HLP, prediabetes who presents today for routine followup  Last OV dec 2019 Started on amlodipine 5mg  once a day Overall doing well Continues to work on diet Not exercising Had a period in Jan and Feb, they were normal periods, prior to that last period summer of 2019  Lab Results  Component Value Date   HGBA1C 5.8 (H) 10/17/2018   HGBA1C 5.9 (H) 09/01/2017   Lab Results  Component Value Date   LDLCALC 121 (H) 10/17/2018   CREATININE 0.68 08/01/2018    Fall Risk  01/16/2019 10/17/2018 08/22/2018 08/01/2018  Falls in the past year? 0 0 No No  Number falls in past yr: 0 - - -  Injury with Fall? 0 - - -     Depression screen [facility] 2/9 01/16/2019 10/17/2018 08/22/2018  Decreased Interest 0 0 0  Down, Depressed, Hopeless 0 0 0  PHQ - 2 Score 0 0 0    No Known Allergies  Prior to Admission medications   Medication Sig Start Date End Date Taking? Authorizing Provider  acetaminophen (TYLENOL) 325 MG tablet Take 650 mg by mouth every 6 (six) hours as needed.   Yes [provider]  amLODipine (NORVASC) 5 MG tablet Take 1 tablet (5 mg total) by mouth daily. 10/17/18  Yes Rutherford Guys, MD  ibuprofen (ADVIL,MOTRIN) 600 MG tablet Take 1 tablet (600 mg total) by mouth every 8 (eight) hours as needed. 09/01/17  Yes Joretta Bachelor, PA    Past Medical History:  Diagnosis Date  . Allergy   . Arthritis    left ankle  . Atrial flutter ([place])    ruled out by Dr Rockey Situ  . Fibroid tumor 2007   removed from uterus   . Nipple discharge  in female    left  . Prediabetes     Past Surgical History:  Procedure Laterality Date  . BREAST BIOPSY Left 09/27/2015   U/S Core  . BREAST DUCTAL SYSTEM EXCISION Left 07/23/2017   Procedure: LEFT BREAST SUBAREOLAR DUCTAL EXCISION;  Surgeon: Jovita Kussmaul, MD;  Location: [facility];  Service: General;  Laterality: Left;  . BREAST EXCISIONAL BIOPSY    . UTERINE FIBROID SURGERY      Social History   Tobacco Use  . Smoking status: Never Smoker  . Smokeless tobacco: Never Used  Substance Use Topics  . Alcohol use: No    Family History  Problem Relation Age of Onset  . Hypertension Mother   . Hyperlipidemia Mother   . Diabetes Mother   . Hypertension Father   . Hyperlipidemia Father   . Cancer Father     ROS Per hpi  OBJECTIVE:  Blood pressure 140/90, pulse 85, temperature 98.5 F (36.9 C), height 5' 7.5" (1.715 m), weight 223 lb 6.4 oz (101.3 kg), SpO2 99 %. Body mass index is 34.47 kg/m.   Wt Readings from Last 3 Encounters:  01/16/19 223 lb 6.4 oz (101.3 kg)  10/17/18 232 lb 3.2 oz (105.3 kg)  08/22/18 221 lb (100.2 kg)    Physical Exam Vitals signs and nursing note reviewed.  Constitutional:      Appearance: She is well-developed.  HENT:     Head: Normocephalic and atraumatic.  Eyes:     General: No scleral icterus.    Conjunctiva/sclera: Conjunctivae normal.     Pupils: Pupils are equal, round, and reactive to light.  Neck:     Musculoskeletal: Neck supple.  Pulmonary:     Effort: Pulmonary effort is normal.  Skin:    General: Skin is warm and dry.  Neurological:     Mental Status: She is alert and oriented to person, place, and time.     ASSESSMENT and PLAN  1. Essential hypertension Controlled. Continue current regime.  - Basic Metabolic Panel; Future  2. Prediabetes Continue with LFM.  - Hemoglobin A1c; Future  Other orders - amLODipine (NORVASC) 5 MG tablet; Take 1 tablet (5 mg total) by mouth daily.    Return in  about 3 months (around 04/18/2019).    Rutherford Guys, MD Primary Care at [place], [place] [address] Ph.  [phone] Fax [phone]

## 2019-01-16 NOTE — Patient Instructions (Signed)
° ° ° °  If you have lab work done today you will be contacted with your lab results within the next 2 weeks.  If you have not heard from us then please contact us. The fastest way to get your results is to register for My Chart. ° ° °IF you received an x-ray today, you will receive an invoice from [facility]. Please contact [facility] at [phone] with questions or concerns regarding your invoice.  ° °IF you received labwork today, you will receive an invoice from [facility]. Please contact [facility] at [phone] with questions or concerns regarding your invoice.  ° °Our billing staff will not be able to assist you with questions regarding bills from these companies. ° °You will be contacted with the lab results as soon as they are available. The fastest way to get your results is to activate your My Chart account. Instructions are located on the last page of this paperwork. If you have not heard from us regarding the results in 2 weeks, please contact this office. °  ° ° ° °

## 2019-04-14 ENCOUNTER — Ambulatory Visit (INDEPENDENT_AMBULATORY_CARE_PROVIDER_SITE_OTHER): Payer: BC Managed Care – PPO | Admitting: Family Medicine

## 2019-04-14 ENCOUNTER — Other Ambulatory Visit: Payer: Self-pay

## 2019-04-14 ENCOUNTER — Telehealth: Payer: Self-pay | Admitting: Family Medicine

## 2019-04-14 DIAGNOSIS — R7303 Prediabetes: Secondary | ICD-10-CM

## 2019-04-14 DIAGNOSIS — I10 Essential (primary) hypertension: Secondary | ICD-10-CM

## 2019-04-14 NOTE — Telephone Encounter (Signed)
Called pt LVM for her to call back and reschedule appt for 04/17/2019 and to comin earlier today for her labs FR

## 2019-04-14 NOTE — Progress Notes (Signed)
Lab only 

## 2019-04-15 LAB — BASIC METABOLIC PANEL
BUN/Creatinine Ratio: 22 (ref 9–23)
BUN: 15 mg/dL (ref 6–24)
CO2: 20 mmol/L (ref 20–29)
Calcium: 9.4 mg/dL (ref 8.7–10.2)
Chloride: 103 mmol/L (ref 96–106)
Creatinine, Ser: 0.67 mg/dL (ref 0.57–1.00)
GFR calc Af Amer: 117 mL/min/{1.73_m2} (ref >59)
GFR calc non Af Amer: 101 mL/min/{1.73_m2} (ref >59)
Glucose: 83 mg/dL (ref 65–99)
Potassium: 4 mmol/L (ref 3.5–5.2)
Sodium: 138 mmol/L (ref 134–144)

## 2019-04-15 LAB — HEMOGLOBIN A1C
Est. average glucose Bld gHb Est-mCnc: 126 mg/dL
Hgb A1c MFr Bld: 6 % — ABNORMAL HIGH (ref 4.8–5.6)

## 2019-04-17 ENCOUNTER — Encounter: Payer: Self-pay | Admitting: Family Medicine

## 2019-04-17 ENCOUNTER — Ambulatory Visit (INDEPENDENT_AMBULATORY_CARE_PROVIDER_SITE_OTHER): Payer: BC Managed Care – PPO | Admitting: Family Medicine

## 2019-04-17 ENCOUNTER — Other Ambulatory Visit: Payer: Self-pay

## 2019-04-17 ENCOUNTER — Ambulatory Visit: Payer: BLUE CROSS/BLUE SHIELD | Admitting: Family Medicine

## 2019-04-17 VITALS — BP 138/88 | HR 89 | Temp 98.3°F | Ht 67.5 in | Wt 227.0 lb

## 2019-04-17 DIAGNOSIS — R7303 Prediabetes: Secondary | ICD-10-CM | POA: Diagnosis not present

## 2019-04-17 DIAGNOSIS — N62 Hypertrophy of breast: Secondary | ICD-10-CM

## 2019-04-17 DIAGNOSIS — I10 Essential (primary) hypertension: Secondary | ICD-10-CM | POA: Diagnosis not present

## 2019-04-17 DIAGNOSIS — Z1211 Encounter for screening for malignant neoplasm of colon: Secondary | ICD-10-CM

## 2019-04-17 MED ORDER — NYSTATIN 100000 UNIT/GM EX CREA: 1.0000 "application " | TOPICAL_CREAM | Freq: Two times a day (BID) | CUTANEOUS | 0 refills | Status: DC

## 2019-04-17 MED ORDER — AMLODIPINE BESYLATE 5 MG PO TABS: 5.0000 mg | ORAL_TABLET | Freq: Every day | ORAL | 3 refills | Status: DC

## 2019-04-17 NOTE — Patient Instructions (Addendum)
If you have lab work done today you will be contacted with your lab results within the next 2 weeks.  If you have not heard from [place] then please contact us. The fastest way to get your results is to register for My Chart.   IF you received an x-ray today, you will receive an invoice from [facility]. Please contact [facility] at [phone] with questions or concerns regarding your invoice.   IF you received labwork today, you will receive an invoice from [place]. Please contact [facility] at [phone] with questions or concerns regarding your invoice.   Our billing staff will not be able to assist you with questions regarding bills from these companies.  You will be contacted with the lab results as soon as they are available. The fastest way to get your results is to activate your My Chart account. Instructions are located on the last page of this paperwork. If you have not heard from [place] regarding the results in 2 weeks, please contact this office.     Prediabetes Eating Plan Prediabetes is a condition that causes blood sugar (glucose) levels to be higher than normal. This increases the risk for developing diabetes. In order to prevent diabetes from developing, your health care provider may recommend a diet and other lifestyle changes to help you:  Control your blood glucose levels.  Improve your cholesterol levels.  Manage your blood pressure. Your health care provider may recommend working with a diet and nutrition specialist (dietitian) to make a meal plan that is best for you. What are tips for following this plan? Lifestyle  Set weight loss goals with the help of your health care team. It is recommended that most people with prediabetes lose 7% of their current body weight.  Exercise for at least 30 minutes at least 5 days a week.  Attend a support group or seek ongoing support from a mental health counselor.  Take over-the-counter and prescription  medicines only as told by your health care provider. Reading food labels  Read food labels to check the amount of fat, salt (sodium), and sugar in prepackaged foods. Avoid foods that have: ? Saturated fats. ? Trans fats. ? Added sugars.  Avoid foods that have more than 300 milligrams (mg) of sodium per serving. Limit your daily sodium intake to less than 2,300 mg each day. Shopping  Avoid buying pre-made and processed foods. Cooking  Cook with olive oil. Do not use butter, lard, or ghee.  Bake, broil, grill, or boil foods. Avoid frying. Meal planning   Work with your dietitian to develop an eating plan that is right for you. This may include: ? Tracking how many calories you take in. Use a food diary, notebook, or mobile application to track what you eat at each meal. ? Using the glycemic index (GI) to plan your meals. The index tells you how quickly a food will raise your blood glucose. Choose low-GI foods. These foods take a longer time to raise blood glucose.  Consider following a Mediterranean diet. This diet includes: ? Several servings each day of fresh fruits and vegetables. ? Eating fish at least twice a week. ? Several servings each day of whole grains, beans, nuts, and seeds. ? Using olive oil instead of other fats. ? Moderate alcohol consumption. ? Eating small amounts of red meat and whole-fat dairy.  If you have high blood pressure, you may need to limit your sodium intake or follow a diet such as the  DASH eating plan. DASH is an eating plan that aims to lower high blood pressure. What foods are recommended? The items listed below may not be a complete list. Talk with your dietitian about what dietary choices are best for you. Grains Whole grains, such as whole-wheat or whole-grain breads, crackers, cereals, and pasta. Unsweetened oatmeal. Bulgur. Barley. Quinoa. Brown rice. Corn or whole-wheat flour tortillas or taco shells. Vegetables Lettuce. Spinach. Peas.  Beets. Cauliflower. Cabbage. Broccoli. Carrots. Tomatoes. Squash. Eggplant. Herbs. Peppers. Onions. Cucumbers. Brussels sprouts. Fruits Berries. Bananas. Apples. Oranges. Grapes. Papaya. Mango. Pomegranate. Kiwi. Grapefruit. Cherries. Meats and other protein foods Seafood. Poultry without skin. Lean cuts of pork and beef. Tofu. Eggs. Nuts. Beans. Dairy Low-fat or fat-free dairy products, such as yogurt, cottage cheese, and cheese. Beverages Water. Tea. Coffee. Sugar-free or diet soda. Seltzer water. Lowfat or no-fat milk. Milk alternatives, such as soy or almond milk. Fats and oils Olive oil. Canola oil. Sunflower oil. Grapeseed oil. Avocado. Walnuts. Sweets and desserts Sugar-free or low-fat pudding. Sugar-free or low-fat ice cream and other frozen treats. Seasoning and other foods Herbs. Sodium-free spices. Mustard. Relish. Low-fat, low-sugar ketchup. Low-fat, low-sugar barbecue sauce. Low-fat or fat-free mayonnaise. What foods are not recommended? The items listed below may not be a complete list. Talk with your dietitian about what dietary choices are best for you. Grains Refined white flour and flour products, such as bread, pasta, snack foods, and cereals. Vegetables Canned vegetables. Frozen vegetables with butter or cream sauce. Fruits Fruits canned with syrup. Meats and other protein foods Fatty cuts of meat. Poultry with skin. Breaded or fried meat. Processed meats. Dairy Full-fat yogurt, cheese, or milk. Beverages Sweetened drinks, such as sweet iced tea and soda. Fats and oils Butter. Lard. Ghee. Sweets and desserts Baked goods, such as cake, cupcakes, pastries, cookies, and cheesecake. Seasoning and other foods Spice mixes with added salt. Ketchup. Barbecue sauce. Mayonnaise. Summary  To prevent diabetes from developing, you may need to make diet and other lifestyle changes to help control blood sugar, improve cholesterol levels, and manage your blood  pressure.  Set weight loss goals with the help of your health care team. It is recommended that most people with prediabetes lose 7 percent of their current body weight.  Consider following a Mediterranean diet that includes plenty of fresh fruits and vegetables, whole grains, beans, nuts, seeds, fish, lean meat, low-fat dairy, and healthy oils. This information is not intended to replace advice given to you by your health care provider. Make sure you discuss any questions you have with your health care provider. Document Released: 03/12/2015 Document Revised: 12/30/2016 Document Reviewed: 12/30/2016 Elsevier Interactive Patient Education  2019 [employer].

## 2019-04-17 NOTE — Progress Notes (Signed)
6/8/202011:04 AM  Vicki French 08/01/1966, 53 y.o., female [id number]  Chief Complaint  Patient presents with  . Hypertension    results of recent labs  . Breast Problem    Wants to talk about the process of breast reduction. Having pain in the shoulders and irritation underneath    HPI:   Patient is a 53 y.o. female with past medical history significant for for HTN, HLP, prediabetes who presents today for routine followup  Last OV March 2020 No changes made  Requesting colon cancer screening be changed to cologuard as she has no one to drive her back from colonoscopy. Denies any fhx colon cancer Denies any abd pain, nausea, vomiting,changes to BM, blood in stools  Checks BP at home Similar to today  Has been considering breast reduction for many years Specially when she had incident of left nipple discharge Then saw plastic [facility] and was told she would be a good candidate Difficult to keep yeast under breast under control - requesting treatment Jeanelle Malling are always hurting her shoulders Denies any lumps or nipple discharge Last mammo nov 2019 - benign  Lab Results  Component Value Date   HGBA1C 6.0 (H) 04/14/2019   Lab Results  Component Value Date   CREATININE 0.67 04/14/2019   BUN 15 04/14/2019   NA 138 04/14/2019   K 4.0 04/14/2019   CL 103 04/14/2019   CO2 20 04/14/2019    BP Readings from Last 3 Encounters:  04/17/19 138/88  01/16/19 140/90  10/17/18 (!) 154/102   Wt Readings from Last 3 Encounters:  04/17/19 227 lb (103 kg)  01/16/19 223 lb 6.4 oz (101.3 kg)  10/17/18 232 lb 3.2 oz (105.3 kg)    Fall Risk  04/17/2019 01/16/2019 10/17/2018 08/22/2018 08/01/2018  Falls in the past year? 0 0 0 No No  Number falls in past yr: 0 0 - - -  Injury with Fall? 0 0 - - -     Depression screen [facility] 2/9 01/16/2019 10/17/2018 08/22/2018  Decreased Interest 0 0 0  Down, Depressed, Hopeless 0 0 0  PHQ - 2 Score 0 0 0    No Known Allergies  Prior to  Admission medications   Medication Sig Start Date End Date Taking? Authorizing Provider  acetaminophen (TYLENOL) 325 MG tablet Take 650 mg by mouth every 6 (six) hours as needed.   Yes [provider]  amLODipine (NORVASC) 5 MG tablet Take 1 tablet (5 mg total) by mouth daily. 01/16/19  Yes Rutherford Guys, MD  ibuprofen (ADVIL,MOTRIN) 600 MG tablet Take 1 tablet (600 mg total) by mouth every 8 (eight) hours as needed. 09/01/17  Yes Joretta Bachelor, PA    Past Medical History:  Diagnosis Date  . Allergy   . Arthritis    left ankle  . Atrial flutter ([place])    ruled out by Dr Rockey Situ  . Fibroid tumor 2007   removed from uterus   . Nipple discharge in female    left  . Prediabetes     Past Surgical History:  Procedure Laterality Date  . BREAST BIOPSY Left 09/27/2015   U/S Core  . BREAST DUCTAL SYSTEM EXCISION Left 07/23/2017   Procedure: LEFT BREAST SUBAREOLAR DUCTAL EXCISION;  Surgeon: Jovita Kussmaul, MD;  Location: [facility];  Service: General;  Laterality: Left;  . BREAST EXCISIONAL BIOPSY    . UTERINE FIBROID SURGERY      Social History   Tobacco Use  .  Smoking status: Never Smoker  . Smokeless tobacco: Never Used  Substance Use Topics  . Alcohol use: No    Family History  Problem Relation Age of Onset  . Hypertension Mother   . Hyperlipidemia Mother   . Diabetes Mother   . Hypertension Father   . Hyperlipidemia Father   . Cancer Father     Review of Systems  Constitutional: Negative for chills and fever.  Respiratory: Negative for cough and shortness of breath.   Cardiovascular: Negative for chest pain, palpitations and leg swelling.  Gastrointestinal: Negative for abdominal pain, nausea and vomiting.     OBJECTIVE:  Today's Vitals   04/17/19 1053  BP: 138/88  Pulse: 89  Temp: 98.3 F (36.8 C)  TempSrc: Oral  SpO2: 100%  Weight: 227 lb (103 kg)  Height: 5' 7.5" (1.715 m)   Body mass index is 35.03 kg/m.   Physical  Exam Vitals signs and nursing note reviewed.  Constitutional:      Appearance: She is well-developed.  HENT:     Head: Normocephalic and atraumatic.  Eyes:     General: No scleral icterus.    Conjunctiva/sclera: Conjunctivae normal.     Pupils: Pupils are equal, round, and reactive to light.  Neck:     Musculoskeletal: Neck supple.  Pulmonary:     Effort: Pulmonary effort is normal.  Skin:    General: Skin is warm and dry.  Neurological:     Mental Status: She is alert and oriented to person, place, and time.     ASSESSMENT and PLAN  1. Essential hypertension Controlled. Continue current regime.   2. Prediabetes Stable. Discussed LFM and weight loss.  3. Symptomatic mammary hypertrophy rx for nystatin given. Discussed strategies to keep area dry and clean. - Ambulatory referral to Plastic Surgery  4. Screening for colon cancer - Cologuard  Other orders - amLODipine (NORVASC) 5 MG tablet; Take 1 tablet (5 mg total) by mouth daily. - nystatin cream (MYCOSTATIN); Apply 1 application topically 2 (two) times daily.  Return in about 6 months (around 10/17/2019) for CPE.    Rutherford Guys, MD Primary Care at [place], [place] [address] Ph.  [phone] Fax [phone]

## 2019-05-09 ENCOUNTER — Ambulatory Visit: Payer: BC Managed Care – PPO | Admitting: Family Medicine

## 2019-05-19 LAB — COLOGUARD: Cologuard: NEGATIVE

## 2019-06-06 ENCOUNTER — Telehealth: Payer: Self-pay

## 2019-06-06 NOTE — Telephone Encounter (Signed)
Cologuard results sent via mail to pt home address. Dgaddy, CMA

## 2019-08-25 ENCOUNTER — Other Ambulatory Visit: Payer: Self-pay | Admitting: General Surgery

## 2019-08-25 DIAGNOSIS — Z1231 Encounter for screening mammogram for malignant neoplasm of breast: Secondary | ICD-10-CM

## 2019-08-31 ENCOUNTER — Ambulatory Visit (INDEPENDENT_AMBULATORY_CARE_PROVIDER_SITE_OTHER): Payer: BC Managed Care – PPO | Admitting: Family Medicine

## 2019-08-31 ENCOUNTER — Other Ambulatory Visit: Payer: Self-pay

## 2019-08-31 DIAGNOSIS — Z23 Encounter for immunization: Secondary | ICD-10-CM | POA: Diagnosis not present

## 2019-10-23 ENCOUNTER — Other Ambulatory Visit: Payer: Self-pay

## 2019-10-23 ENCOUNTER — Ambulatory Visit (INDEPENDENT_AMBULATORY_CARE_PROVIDER_SITE_OTHER): Payer: BC Managed Care – PPO | Admitting: Family Medicine

## 2019-10-23 ENCOUNTER — Encounter: Payer: Self-pay | Admitting: Family Medicine

## 2019-10-23 VITALS — BP 140/87 | HR 90 | Temp 98.3°F | Ht 67.5 in | Wt 221.8 lb

## 2019-10-23 DIAGNOSIS — Z0001 Encounter for general adult medical examination with abnormal findings: Secondary | ICD-10-CM | POA: Diagnosis not present

## 2019-10-23 DIAGNOSIS — I10 Essential (primary) hypertension: Secondary | ICD-10-CM

## 2019-10-23 DIAGNOSIS — R7303 Prediabetes: Secondary | ICD-10-CM

## 2019-10-23 DIAGNOSIS — Z0000 Encounter for general adult medical examination without abnormal findings: Secondary | ICD-10-CM

## 2019-10-23 DIAGNOSIS — Z13220 Encounter for screening for lipoid disorders: Secondary | ICD-10-CM

## 2019-10-23 DIAGNOSIS — Z23 Encounter for immunization: Secondary | ICD-10-CM

## 2019-10-23 DIAGNOSIS — Z130 Encounter for screening for diseases of the blood and blood-forming organs and certain disorders involving the immune mechanism: Secondary | ICD-10-CM

## 2019-10-23 MED ORDER — NYSTATIN 100000 UNIT/GM EX CREA: 1.0000 "application " | TOPICAL_CREAM | Freq: Two times a day (BID) | CUTANEOUS | 0 refills | Status: DC

## 2019-10-23 NOTE — Progress Notes (Signed)
12/14/202010:12 AM  Vicki French [birth year], 53 y.o., female [id number]  Chief Complaint  Patient presents with  . Annual Exam    HPI:   Patient is a 53 y.o. female with past medical history significant for HTN and prediabetes who presents today for CPE  Last CPE Dec 2019 Cervical Cancer Screening: ascus neg HPV, next due oct 2021 Menses: starting to become irregular, not heavy, no significant vasomotor sx, LMP Oct 09 2019 Breast Cancer Screening: scheduled for Jan 2021 Colorectal Cancer Screening: cologuard July 2020 Bone Density Testing: at age 78 HIV Screening: 2018 STI Screening: 2016 Seasonal Influenza Vaccination: UTD Td/Tdap Vaccination: 2018 Pneumococcal Vaccination: at age 50 Zoster Vaccination: today Frequency of Dental evaluation: Q6 months Frequency of Eye evaluation: due feb 2021   Hearing Screening   125Hz  250Hz  500Hz  1000Hz  2000Hz  3000Hz  4000Hz  6000Hz  8000Hz   Right ear:           Left ear:             Visual Acuity Screening   Right eye Left eye Both eyes  Without correction:     With correction: 20/40 20/25 20/20     Depression screen [facility] 2/9 01/16/2019 10/17/2018 08/22/2018  Decreased Interest 0 0 0  Down, Depressed, Hopeless 0 0 0  PHQ - 2 Score 0 0 0    Fall Risk  04/17/2019 01/16/2019 10/17/2018 08/22/2018 08/01/2018  Falls in the past year? 0 0 0 No No  Number falls in past yr: 0 0 - - -  Injury with Fall? 0 0 - - -     No Known Allergies  Prior to Admission medications   Medication Sig Start Date End Date Taking? Authorizing Provider  acetaminophen (TYLENOL) 325 MG tablet Take 650 mg by mouth every 6 (six) hours as needed.   Yes [provider]  amLODipine (NORVASC) 5 MG tablet Take 1 tablet (5 mg total) by mouth daily. 04/17/19  Yes Rutherford Guys, MD  nystatin cream (MYCOSTATIN) Apply 1 application topically 2 (two) times daily. 04/17/19  Yes Rutherford Guys, MD    Past Medical History:  Diagnosis Date  . Allergy   . Arthritis     left ankle  . Atrial flutter ([place])    ruled out by Dr Rockey Situ  . Fibroid tumor 2007   removed from uterus   . Nipple discharge in female    left  . Prediabetes     Past Surgical History:  Procedure Laterality Date  . BREAST BIOPSY Left 09/27/2015   U/S Core  . BREAST DUCTAL SYSTEM EXCISION Left 07/23/2017   Procedure: LEFT BREAST SUBAREOLAR DUCTAL EXCISION;  Surgeon: Jovita Kussmaul, MD;  Location: [facility];  Service: General;  Laterality: Left;  . BREAST EXCISIONAL BIOPSY    . UTERINE FIBROID SURGERY      Social History   Tobacco Use  . Smoking status: Never Smoker  . Smokeless tobacco: Never Used  Substance Use Topics  . Alcohol use: No    Family History  Problem Relation Age of Onset  . Hypertension Mother   . Hyperlipidemia Mother   . Diabetes Mother   . Hypertension Father   . Hyperlipidemia Father   . Cancer Father     Review of Systems  Constitutional: Negative for chills and fever.  Respiratory: Negative for cough and shortness of breath.   Cardiovascular: Negative for chest pain, palpitations and leg swelling.  Gastrointestinal: Negative for abdominal pain, nausea and vomiting.  All  other systems reviewed and are negative.    OBJECTIVE:  Today's Vitals   10/23/19 1008  BP: 140/87  Pulse: 90  Temp: 98.3 F (36.8 C)  SpO2: 98%  Weight: 221 lb 12.8 oz (100.6 kg)  Height: 5' 7.5" (1.715 m)   Body mass index is 34.23 kg/m.  Wt Readings from Last 3 Encounters:  10/23/19 221 lb 12.8 oz (100.6 kg)  04/17/19 227 lb (103 kg)  01/16/19 223 lb 6.4 oz (101.3 kg)    Physical Exam Vitals and nursing note reviewed. Exam conducted with a chaperone present.  Constitutional:      Appearance: She is well-developed.  HENT:     Head: Normocephalic and atraumatic.     Right Ear: Hearing, tympanic membrane, ear canal and external ear normal.     Left Ear: Hearing, tympanic membrane, ear canal and external ear normal.     Mouth/Throat:      Mouth: Mucous membranes are moist.     Pharynx: No oropharyngeal exudate or posterior oropharyngeal erythema.  Eyes:     Extraocular Movements: Extraocular movements intact.     Conjunctiva/sclera: Conjunctivae normal.     Pupils: Pupils are equal, round, and reactive to light.  Neck:     Thyroid: No thyromegaly.  Cardiovascular:     Rate and Rhythm: Normal rate and regular rhythm.     Heart sounds: Normal heart sounds. No murmur. No friction rub. No gallop.   Pulmonary:     Effort: Pulmonary effort is normal.     Breath sounds: Normal breath sounds. No wheezing, rhonchi or rales.  Chest:     Breasts:        Right: Normal. No mass, nipple discharge or skin change.        Left: Normal. No mass, nipple discharge or skin change.  Abdominal:     General: Bowel sounds are normal. There is no distension.     Palpations: Abdomen is soft. There is no hepatomegaly, splenomegaly or mass.     Tenderness: There is no abdominal tenderness.  Musculoskeletal:        General: Normal range of motion.     Cervical back: Neck supple.     Right lower leg: No edema.     Left lower leg: No edema.  Lymphadenopathy:     Cervical: No cervical adenopathy.     Upper Body:     Right upper body: No supraclavicular, axillary or pectoral adenopathy.     Left upper body: No supraclavicular, axillary or pectoral adenopathy.  Skin:    General: Skin is warm and dry.  Neurological:     Mental Status: She is alert and oriented to person, place, and time.     Cranial Nerves: No cranial nerve deficit.     Gait: Gait normal.     Deep Tendon Reflexes: Reflexes are normal and symmetric.  Psychiatric:        Mood and Affect: Mood normal.        Behavior: Behavior normal.     No results found for this or any previous visit (from the past 24 hour(s)).  No results found.   ASSESSMENT and PLAN  1. Annual physical exam No concerns per history or exam. Routine HCM labs ordered. HCM reviewed/discussed.  Anticipatory guidance regarding healthy weight, lifestyle and choices given.   2. Essential hypertension Controlled. Continue current regime.  - CMET with GFR  3. Prediabetes Labs pending, cont with LFM - Hemoglobin A1c  4. Screening for lipid  disorders - Lipid panel  5. Screening for deficiency anemia - CBC  6. Need for shingles vaccine - Varicella-zoster vaccine IM (Shingrix)  Other orders - nystatin cream (MYCOSTATIN); Apply 1 application topically 2 (two) times daily.  Return in about 6 months (around 04/22/2020).    Rutherford Guys, MD Primary Care at [place], [place] [address] Ph.  [phone] Fax [phone]

## 2019-10-23 NOTE — Patient Instructions (Addendum)
Return in 2 months for nurse visit, shingles vaccine   If you have lab work done today you will be contacted with your lab results within the next 2 weeks.  If you have not heard from [place] then please contact us. The fastest way to get your results is to register for My Chart.   IF you received an x-ray today, you will receive an invoice from [facility]. Please contact [facility] at [phone] with questions or concerns regarding your invoice.   IF you received labwork today, you will receive an invoice from [facility]. Please contact [facility] at [phone] with questions or concerns regarding your invoice.   Our billing staff will not be able to assist you with questions regarding bills from these companies.  You will be contacted with the lab results as soon as they are available. The fastest way to get your results is to activate your My Chart account. Instructions are located on the last page of this paperwork. If you have not heard from [place] regarding the results in 2 weeks, please contact this office.     Preventive Care 53-61 Years Old, Female Preventive care refers to visits with your health care provider and lifestyle choices that can promote health and wellness. This includes:  A yearly physical exam. This may also be called an annual well check.  Regular dental visits and eye exams.  Immunizations.  Screening for certain conditions.  Healthy lifestyle choices, such as eating a healthy diet, getting regular exercise, not using drugs or products that contain nicotine and tobacco, and limiting alcohol use. What can I expect for my preventive care visit? Physical exam Your health care provider will check your:  Height and weight. This may be used to calculate body mass index (BMI), which tells if you are at a healthy weight.  Heart rate and blood pressure.  Skin for abnormal spots. Counseling Your health care provider may ask you questions about  your:  Alcohol, tobacco, and drug use.  Emotional well-being.  Home and relationship well-being.  Sexual activity.  Eating habits.  Work and work Statistician.  Method of birth control.  Menstrual cycle.  Pregnancy history. What immunizations do I need?  Influenza (flu) vaccine  This is recommended every year. Tetanus, diphtheria, and pertussis (Tdap) vaccine  You may need a Td booster every 10 years. Varicella (chickenpox) vaccine  You may need this if you have not been vaccinated. Zoster (shingles) vaccine  You may need this after age 66. Measles, mumps, and rubella (MMR) vaccine  You may need at least one dose of MMR if you were born in 1957 or later. You may also need a second dose. Pneumococcal conjugate (PCV13) vaccine  You may need this if you have certain conditions and were not previously vaccinated. Pneumococcal polysaccharide (PPSV23) vaccine  You may need one or two doses if you smoke cigarettes or if you have certain conditions. Meningococcal conjugate (MenACWY) vaccine  You may need this if you have certain conditions. Hepatitis A vaccine  You may need this if you have certain conditions or if you travel or work in places where you may be exposed to hepatitis A. Hepatitis B vaccine  You may need this if you have certain conditions or if you travel or work in places where you may be exposed to hepatitis B. Haemophilus influenzae type b (Hib) vaccine  You may need this if you have certain conditions. Human papillomavirus (HPV) vaccine  If recommended by your health care provider,  you may need three doses over 6 months. You may receive vaccines as individual doses or as more than one vaccine together in one shot (combination vaccines). Talk with your health care provider about the risks and benefits of combination vaccines. What tests do I need? Blood tests  Lipid and cholesterol levels. These may be checked every 5 years, or more frequently if  you are over 73 years old.  Hepatitis C test.  Hepatitis B test. Screening  Lung cancer screening. You may have this screening every year starting at age 32 if you have a 30-pack-year history of smoking and currently smoke or have quit within the past 15 years.  Colorectal cancer screening. All adults should have this screening starting at age 52 and continuing until age 87. Your health care provider may recommend screening at age 34 if you are at increased risk. You will have tests every 1-10 years, depending on your results and the type of screening test.  Diabetes screening. This is done by checking your blood sugar (glucose) after you have not eaten for a while (fasting). You may have this done every 1-3 years.  Mammogram. This may be done every 1-2 years. Talk with your health care provider about when you should start having regular mammograms. This may depend on whether you have a family history of breast cancer.  BRCA-related cancer screening. This may be done if you have a family history of breast, ovarian, tubal, or peritoneal cancers.  Pelvic exam and Pap test. This may be done every 3 years starting at age 81. Starting at age 46, this may be done every 5 years if you have a Pap test in combination with an HPV test. Other tests  Sexually transmitted disease (STD) testing.  Bone density scan. This is done to screen for osteoporosis. You may have this scan if you are at high risk for osteoporosis. Follow these instructions at home: Eating and drinking  Eat a diet that includes fresh fruits and vegetables, whole grains, lean protein, and low-fat dairy.  Take vitamin and mineral supplements as recommended by your health care provider.  Do not drink alcohol if: ? Your health care provider tells you not to drink. ? You are pregnant, may be pregnant, or are planning to become pregnant.  If you drink alcohol: ? Limit how much you have to 0-1 drink a day. ? Be aware of how much  alcohol is in your drink. In the [place] one drink equals one 12 oz bottle of beer (355 mL), one 5 oz glass of wine (148 mL), or one 1 oz glass of hard liquor (44 mL). Lifestyle  Take daily care of your teeth and gums.  Stay active. Exercise for at least 30 minutes on 5 or more days each week.  Do not use any products that contain nicotine or tobacco, such as cigarettes, e-cigarettes, and chewing tobacco. If you need help quitting, ask your health care provider.  If you are sexually active, practice safe sex. Use a condom or other form of birth control (contraception) in order to prevent pregnancy and STIs (sexually transmitted infections).  If told by your health care provider, take low-dose aspirin daily starting at age 56. What's next?  Visit your health care provider once a year for a well check visit.  Ask your health care provider how often you should have your eyes and teeth checked.  Stay up to date on all vaccines. This information is not intended to replace advice given to  you by your health care provider. Make sure you discuss any questions you have with your health care provider. Document Released: 11/22/2015 Document Revised: 07/07/2018 Document Reviewed: 07/07/2018 Elsevier Patient Education  2020 [employer].

## 2019-10-24 LAB — CMP14+EGFR
ALT: 16 IU/L (ref 0–32)
AST: 16 IU/L (ref 0–40)
Albumin/Globulin Ratio: 1.6 (ref 1.2–2.2)
Albumin: 4.6 g/dL (ref 3.8–4.9)
Alkaline Phosphatase: 86 IU/L (ref 39–117)
BUN/Creatinine Ratio: 18 (ref 9–23)
BUN: 14 mg/dL (ref 6–24)
Bilirubin Total: 0.4 mg/dL (ref 0.0–1.2)
CO2: 21 mmol/L (ref 20–29)
Calcium: 9.7 mg/dL (ref 8.7–10.2)
Chloride: 102 mmol/L (ref 96–106)
Creatinine, Ser: 0.8 mg/dL (ref 0.57–1.00)
GFR calc Af Amer: 98 mL/min/{1.73_m2} (ref >59)
GFR calc non Af Amer: 85 mL/min/{1.73_m2} (ref >59)
Globulin, Total: 2.9 g/dL (ref 1.5–4.5)
Glucose: 100 mg/dL — ABNORMAL HIGH (ref 65–99)
Potassium: 3.9 mmol/L (ref 3.5–5.2)
Sodium: 140 mmol/L (ref 134–144)
Total Protein: 7.5 g/dL (ref 6.0–8.5)

## 2019-10-24 LAB — CBC
Hematocrit: 39.1 % (ref 34.0–46.6)
Hemoglobin: 12.9 g/dL (ref 11.1–15.9)
MCH: 29.3 pg (ref 26.6–33.0)
MCHC: 33 g/dL (ref 31.5–35.7)
MCV: 89 fL (ref 79–97)
Platelets: 247 10*3/uL (ref 150–450)
RBC: 4.41 x10E6/uL (ref 3.77–5.28)
RDW: 13.1 % (ref 11.7–15.4)
WBC: 5.5 10*3/uL (ref 3.4–10.8)

## 2019-10-24 LAB — LIPID PANEL
Chol/HDL Ratio: 3.5 ratio (ref 0.0–4.4)
Cholesterol, Total: 241 mg/dL — ABNORMAL HIGH (ref 100–199)
HDL: 68 mg/dL (ref >39)
LDL Chol Calc (NIH): 164 mg/dL — ABNORMAL HIGH (ref 0–99)
Triglycerides: 57 mg/dL (ref 0–149)
VLDL Cholesterol Cal: 9 mg/dL (ref 5–40)

## 2019-10-24 LAB — HEMOGLOBIN A1C
Est. average glucose Bld gHb Est-mCnc: 123 mg/dL
Hgb A1c MFr Bld: 5.9 % — ABNORMAL HIGH (ref 4.8–5.6)

## 2019-12-06 ENCOUNTER — Ambulatory Visit
Admission: RE | Admit: 2019-12-06 | Discharge: 2019-12-06 | Disposition: A | Discharge status: Home / Self Care | Payer: BLUE CROSS/BLUE SHIELD | Source: Ambulatory Visit | Attending: General Surgery | Admitting: General Surgery

## 2019-12-06 ENCOUNTER — Other Ambulatory Visit: Payer: Self-pay

## 2019-12-06 DIAGNOSIS — Z1231 Encounter for screening mammogram for malignant neoplasm of breast: Secondary | ICD-10-CM

## 2020-03-03 ENCOUNTER — Other Ambulatory Visit: Payer: Self-pay | Admitting: Family Medicine

## 2020-03-27 ENCOUNTER — Other Ambulatory Visit: Payer: Self-pay | Admitting: Family Medicine

## 2020-03-27 MED ORDER — AMLODIPINE BESYLATE 5 MG PO TABS: 5.0000 mg | ORAL_TABLET | Freq: Every day | ORAL | 0 refills | Status: DC

## 2020-03-27 NOTE — Telephone Encounter (Signed)
Requested Prescriptions  Pending Prescriptions Disp Refills  . amLODipine (NORVASC) 5 MG tablet 90 tablet 0    Sig: Take 1 tablet (5 mg total) by mouth daily.     Cardiovascular:  Calcium Channel Blockers Failed - 03/27/2020  5:05 PM      Failed - Last BP in normal range    BP Readings from Last 1 Encounters:  10/23/19 140/87         Passed - Valid encounter within last 6 months    Recent Outpatient Visits          5 months ago Annual physical exam   [facility] Lilia Argue, MD   6 months ago Need for immunization against influenza   [facility], Lilia Argue, MD   11 months ago Essential hypertension   [facility], Lilia Argue, MD   11 months ago Prediabetes   [facility], Lilia Argue, MD   1 year ago Essential hypertension   [facility], Lilia Argue, MD      Future Appointments            In 2 weeks Rutherford Guys, MD [facility] at [place], [facility]

## 2020-03-27 NOTE — Telephone Encounter (Signed)
Copied from [place] [id number]. Topic: Quick Communication - Rx Refill/Question >> Mar 27, 2020  4:53 PM Rainey Pines A wrote: Medication:amLODipine ([place]) 5 MG tablet  Has the patient contacted their pharmacy? {yes (Agent: If no, request that the patient contact the pharmacy for the refill.) (Agent: If yes, when and what did the pharmacy advise?)contact pcp  Preferred Pharmacy (with phone number or street name):[id number] - [place], [place]  Phone:  [phone] Fax:  [phone]     Agent: Please be advised that RX refills may take up to 3 business days. We ask that you follow-up with your pharmacy.

## 2020-04-15 ENCOUNTER — Other Ambulatory Visit: Payer: Self-pay

## 2020-04-15 ENCOUNTER — Ambulatory Visit (INDEPENDENT_AMBULATORY_CARE_PROVIDER_SITE_OTHER): Payer: BC Managed Care – PPO | Admitting: Family Medicine

## 2020-04-15 ENCOUNTER — Encounter: Payer: Self-pay | Admitting: Family Medicine

## 2020-04-15 VITALS — BP 131/84 | HR 85 | Temp 98.2°F | Ht 67.5 in | Wt 230.2 lb

## 2020-04-15 DIAGNOSIS — R7303 Prediabetes: Secondary | ICD-10-CM | POA: Diagnosis not present

## 2020-04-15 DIAGNOSIS — I10 Essential (primary) hypertension: Secondary | ICD-10-CM | POA: Diagnosis not present

## 2020-04-15 DIAGNOSIS — Z6835 Body mass index (BMI) 35.0-35.9, adult: Secondary | ICD-10-CM | POA: Diagnosis not present

## 2020-04-15 DIAGNOSIS — Z23 Encounter for immunization: Secondary | ICD-10-CM

## 2020-04-15 MED ORDER — AMLODIPINE BESYLATE 5 MG PO TABS: 5.0000 mg | ORAL_TABLET | Freq: Every day | ORAL | 1 refills | Status: DC

## 2020-04-15 NOTE — Patient Instructions (Signed)
° ° ° °  If you have lab work done today you will be contacted with your lab results within the next 2 weeks.  If you have not heard from us then please contact us. The fastest way to get your results is to register for My Chart. ° ° °IF you received an x-ray today, you will receive an invoice from [facility]. Please contact [facility] at [phone] with questions or concerns regarding your invoice.  ° °IF you received labwork today, you will receive an invoice from [facility]. Please contact [facility] at [phone] with questions or concerns regarding your invoice.  ° °Our billing staff will not be able to assist you with questions regarding bills from these companies. ° °You will be contacted with the lab results as soon as they are available. The fastest way to get your results is to activate your My Chart account. Instructions are located on the last page of this paperwork. If you have not heard from us regarding the results in 2 weeks, please contact this office. °  ° ° ° °

## 2020-04-15 NOTE — Progress Notes (Signed)
6/7/20219:08 AM  Vicki French 1966-06-23, 54 y.o., female [id number]  Chief Complaint  Patient presents with  . Hypertension    HPI:   Patient is a 54 y.o. female with past medical history significant for HTN and prediabetes who presents today for routine followup  Last OV dec 2020 - shingrix #1 given  She is overall doing well Her mother died in 07-Feb-2023 - she is coping ok Works 6am-6pm She has been eating more take out She has not been exercising  Wt Readings from Last 3 Encounters:  04/15/20 230 lb 3.2 oz (104.4 kg)  10/23/19 221 lb 12.8 oz (100.6 kg)  04/17/19 227 lb (103 kg)   BP Readings from Last 3 Encounters:  04/15/20 131/84  10/23/19 140/87  08/31/19 132/82   The 10-year ASCVD risk score Mikey Bussing DC Jr., et al., 2013) is: 4.4%  Depression screen [facility] 2/9 04/15/2020 01/16/2019 10/17/2018  Decreased Interest 0 0 0  Down, Depressed, Hopeless 0 0 0  PHQ - 2 Score 0 0 0    Fall Risk  04/15/2020 04/17/2019 01/16/2019 10/17/2018 08/22/2018  Falls in the past year? 0 0 0 0 No  Number falls in past yr: 0 0 0 - -  Injury with Fall? 0 0 0 - -     No Known Allergies  Prior to Admission medications   Medication Sig Start Date End Date Taking? Authorizing Provider  acetaminophen (TYLENOL) 325 MG tablet Take 650 mg by mouth every 6 (six) hours as needed.   Yes [provider]  amLODipine (NORVASC) 5 MG tablet Take 1 tablet (5 mg total) by mouth daily. 03/27/20  Yes Rutherford Guys, MD  nystatin cream (MYCOSTATIN) Apply 1 application topically 2 (two) times daily. 10/23/19  Yes Rutherford Guys, MD    Past Medical History:  Diagnosis Date  . Allergy   . Arthritis    left ankle  . Atrial flutter ([place])    ruled out by Dr Rockey Situ  . Fibroid tumor 2007   removed from uterus   . Nipple discharge in female    left  . Prediabetes     Past Surgical History:  Procedure Laterality Date  . BREAST BIOPSY Left 09/27/2015   U/S Core  . BREAST DUCTAL SYSTEM EXCISION Left  07/23/2017   Procedure: LEFT BREAST SUBAREOLAR DUCTAL EXCISION;  Surgeon: Jovita Kussmaul, MD;  Location: [facility];  Service: General;  Laterality: Left;  . BREAST EXCISIONAL BIOPSY    . UTERINE FIBROID SURGERY      Social History   Tobacco Use  . Smoking status: Never Smoker  . Smokeless tobacco: Never Used  Substance Use Topics  . Alcohol use: No    Family History  Problem Relation Age of Onset  . Hypertension Mother   . Hyperlipidemia Mother   . Diabetes Mother   . Hypertension Father   . Hyperlipidemia Father   . Cancer Father     Review of Systems  Constitutional: Negative for chills and fever.  Respiratory: Negative for cough and shortness of breath.   Cardiovascular: Negative for chest pain, palpitations and leg swelling.  Gastrointestinal: Negative for abdominal pain, nausea and vomiting.     OBJECTIVE:  Today's Vitals   04/15/20 0859  BP: 131/84  Pulse: 85  Temp: 98.2 F (36.8 C)  SpO2: 98%  Weight: 230 lb 3.2 oz (104.4 kg)  Height: 5' 7.5" (1.715 m)   Body mass index is 35.52 kg/m.   Physical  Exam Vitals and nursing note reviewed.  Constitutional:      Appearance: She is well-developed.  HENT:     Head: Normocephalic and atraumatic.     Mouth/Throat:     Pharynx: No oropharyngeal exudate.  Eyes:     General: No scleral icterus.    Conjunctiva/sclera: Conjunctivae normal.     Pupils: Pupils are equal, round, and reactive to light.  Cardiovascular:     Rate and Rhythm: Normal rate and regular rhythm.     Heart sounds: Normal heart sounds. No murmur. No friction rub. No gallop.   Pulmonary:     Effort: Pulmonary effort is normal.     Breath sounds: Normal breath sounds. No wheezing or rales.  Musculoskeletal:     Cervical back: Neck supple.  Skin:    General: Skin is warm and dry.  Neurological:     Mental Status: She is alert and oriented to person, place, and time.     No results found for this or any previous visit  (from the past 24 hour(s)).  No results found.   ASSESSMENT and PLAN  1. Essential hypertension Controlled. Continue current regime.  - Lipid panel - CMP14+EGFR  2. Prediabetes - Hemoglobin A1c  3. BMI 35.0-35.9,adult Discussed LFM, decreased portions sizes, my plate method, calorie counting (1500 cal/day), 150 min of exercise a week.  Patient handouts given.  4. Need for shingles vaccine - Varicella-zoster vaccine IM  Other orders - amLODipine (NORVASC) 5 MG tablet; Take 1 tablet (5 mg total) by mouth daily.  Return in about 6 months (around 10/15/2020).    Rutherford Guys, MD Primary [facility] at [place],  [address] Ph.  [phone] Fax [phone]

## 2020-04-16 LAB — LIPID PANEL
Chol/HDL Ratio: 4.2 ratio (ref 0.0–4.4)
Cholesterol, Total: 246 mg/dL — ABNORMAL HIGH (ref 100–199)
HDL: 59 mg/dL (ref >39)
LDL Chol Calc (NIH): 177 mg/dL — ABNORMAL HIGH (ref 0–99)
Triglycerides: 63 mg/dL (ref 0–149)
VLDL Cholesterol Cal: 10 mg/dL (ref 5–40)

## 2020-04-16 LAB — CMP14+EGFR
ALT: 11 IU/L (ref 0–32)
AST: 16 IU/L (ref 0–40)
Albumin/Globulin Ratio: 1.4 (ref 1.2–2.2)
Albumin: 4.4 g/dL (ref 3.8–4.9)
Alkaline Phosphatase: 82 IU/L (ref 48–121)
BUN/Creatinine Ratio: 16 (ref 9–23)
BUN: 12 mg/dL (ref 6–24)
Bilirubin Total: 0.3 mg/dL (ref 0.0–1.2)
CO2: 21 mmol/L (ref 20–29)
Calcium: 9.4 mg/dL (ref 8.7–10.2)
Chloride: 104 mmol/L (ref 96–106)
Creatinine, Ser: 0.76 mg/dL (ref 0.57–1.00)
GFR calc Af Amer: 104 mL/min/{1.73_m2} (ref >59)
GFR calc non Af Amer: 90 mL/min/{1.73_m2} (ref >59)
Globulin, Total: 3.1 g/dL (ref 1.5–4.5)
Glucose: 107 mg/dL — ABNORMAL HIGH (ref 65–99)
Potassium: 4 mmol/L (ref 3.5–5.2)
Sodium: 138 mmol/L (ref 134–144)
Total Protein: 7.5 g/dL (ref 6.0–8.5)

## 2020-04-16 LAB — HEMOGLOBIN A1C
Est. average glucose Bld gHb Est-mCnc: 120 mg/dL
Hgb A1c MFr Bld: 5.8 % — ABNORMAL HIGH (ref 4.8–5.6)

## 2020-04-18 ENCOUNTER — Encounter: Payer: Self-pay | Admitting: Radiology

## 2020-10-14 ENCOUNTER — Ambulatory Visit: Payer: BC Managed Care – PPO | Admitting: Family Medicine

## 2020-10-14 ENCOUNTER — Encounter: Payer: Self-pay | Admitting: Family Medicine

## 2020-10-14 ENCOUNTER — Other Ambulatory Visit: Payer: Self-pay | Admitting: General Surgery

## 2020-10-14 ENCOUNTER — Ambulatory Visit (INDEPENDENT_AMBULATORY_CARE_PROVIDER_SITE_OTHER): Payer: BC Managed Care – PPO | Admitting: Family Medicine

## 2020-10-14 ENCOUNTER — Other Ambulatory Visit: Payer: Self-pay

## 2020-10-14 VITALS — BP 135/89 | HR 88 | Temp 98.3°F | Ht 67.5 in | Wt 232.0 lb

## 2020-10-14 DIAGNOSIS — Z1231 Encounter for screening mammogram for malignant neoplasm of breast: Secondary | ICD-10-CM

## 2020-10-14 DIAGNOSIS — I10 Essential (primary) hypertension: Secondary | ICD-10-CM

## 2020-10-14 DIAGNOSIS — K5900 Constipation, unspecified: Secondary | ICD-10-CM

## 2020-10-14 DIAGNOSIS — Z23 Encounter for immunization: Secondary | ICD-10-CM

## 2020-10-14 DIAGNOSIS — Z1159 Encounter for screening for other viral diseases: Secondary | ICD-10-CM | POA: Diagnosis not present

## 2020-10-14 DIAGNOSIS — B372 Candidiasis of skin and nail: Secondary | ICD-10-CM

## 2020-10-14 LAB — LIPID PANEL

## 2020-10-14 MED ORDER — POLYETHYLENE GLYCOL 3350 17 GM/SCOOP PO POWD: 17.0000 g | Freq: Every day | ORAL | 1 refills | Status: DC

## 2020-10-14 MED ORDER — NYSTATIN 100000 UNIT/GM EX CREA: 1.0000 "application " | TOPICAL_CREAM | Freq: Two times a day (BID) | CUTANEOUS | 1 refills | Status: DC

## 2020-10-14 MED ORDER — AMLODIPINE BESYLATE 5 MG PO TABS: 5.0000 mg | ORAL_TABLET | Freq: Every day | ORAL | 2 refills | Status: DC

## 2020-10-14 NOTE — Progress Notes (Addendum)
12/6/20219:31 AM  Vicki French 11-Nov-1965, 54 y.o., female [id number]  Chief Complaint  Patient presents with  . Hypertension  . Medication Refill    nystatin cream     HPI:   Patient is a 54 y.o. female with past medical history significant for HTN and prediabetes who presents today for routine follow-up.  Has been wanting a breast reduction Gets rash under her breasts Keeps clean and dry as able  Frequent issues with constipation and bloating Has been trying to improve diet Knows she needs to work on losing weight. Has gained 11 pounds this past year Wt Readings from Last 3 Encounters:  10/14/20 232 lb (105.2 kg)  04/15/20 230 lb 3.2 oz (104.4 kg)  10/23/19 221 lb 12.8 oz (100.6 kg)     Hypertension Daily amlodipine Denies CP, SOB, palpitations, leg swelling BP Readings from Last 3 Encounters:  10/14/20 135/89  04/15/20 131/84  10/23/19 140/87      Depression screen PHQ 2/9 10/14/2020 10/14/2020 04/15/2020  Decreased Interest 0 0 0  Down, Depressed, Hopeless 0 0 0  PHQ - 2 Score 0 0 0    Fall Risk  10/14/2020 10/14/2020 04/15/2020 04/17/2019 01/16/2019  Falls in the past year? 0 0 0 0 0  Number falls in past yr: 0 0 0 0 0  Injury with Fall? 0 0 0 0 0  Follow up Falls evaluation completed Falls evaluation completed - - -     No Known Allergies  Prior to Admission medications   Medication Sig Start Date End Date Taking? Authorizing Provider  acetaminophen (TYLENOL) 325 MG tablet Take 650 mg by mouth every 6 (six) hours as needed.   Yes [provider]  amLODipine (NORVASC) 5 MG tablet Take 1 tablet (5 mg total) by mouth daily. 04/15/20  Yes Rutherford Guys, MD  nystatin cream (MYCOSTATIN) Apply 1 application topically 2 (two) times daily. Patient not taking: Reported on 10/14/2020 10/23/19   Rutherford Guys, MD    Past Medical History:  Diagnosis Date  . Allergy   . Arthritis    left ankle  . Atrial flutter ([place])    ruled out by Dr Rockey Situ  .  Fibroid tumor 2007   removed from uterus   . Hypertension   . Nipple discharge in female    left  . Prediabetes     Past Surgical History:  Procedure Laterality Date  . BREAST BIOPSY Left 09/27/2015   U/S Core  . BREAST DUCTAL SYSTEM EXCISION Left 07/23/2017   Procedure: LEFT BREAST SUBAREOLAR DUCTAL EXCISION;  Surgeon: Jovita Kussmaul, MD;  Location: [facility];  Service: General;  Laterality: Left;  . BREAST EXCISIONAL BIOPSY    . UTERINE FIBROID SURGERY      Social History   Tobacco Use  . Smoking status: Never Smoker  . Smokeless tobacco: Never Used  Substance Use Topics  . Alcohol use: No    Family History  Problem Relation Age of Onset  . Hypertension Mother   . Hyperlipidemia Mother   . Diabetes Mother   . Hypertension Father   . Hyperlipidemia Father   . Cancer Father     Review of Systems  Constitutional: Negative for chills, fever and malaise/fatigue.  Eyes: Negative for blurred vision and double vision.  Respiratory: Negative for cough, shortness of breath and wheezing.   Cardiovascular: Negative for chest pain, palpitations and leg swelling.  Gastrointestinal: Positive for constipation. Negative for abdominal pain, blood in stool,  diarrhea, heartburn, nausea and vomiting.  Genitourinary: Negative for dysuria, frequency and hematuria.  Musculoskeletal: Negative for back pain and joint pain.  Skin: Positive for rash (under bilateral breasts).  Neurological: Negative for dizziness, weakness and headaches.     OBJECTIVE:  Today's Vitals   10/14/20 0858  BP: 135/89  Pulse: 88  Temp: 98.3 F (36.8 C)  SpO2: 99%  Weight: 232 lb (105.2 kg)  Height: 5' 7.5" (1.715 m)   Body mass index is 35.8 kg/m.   Physical Exam Constitutional:      General: She is not in acute distress.    Appearance: Normal appearance. She is not ill-appearing.  HENT:     Head: Normocephalic.  Cardiovascular:     Rate and Rhythm: Normal rate and regular  rhythm.     Pulses: Normal pulses.     Heart sounds: Normal heart sounds. No murmur heard.  No friction rub. No gallop.   Pulmonary:     Effort: Pulmonary effort is normal. No respiratory distress.     Breath sounds: Normal breath sounds. No stridor. No wheezing, rhonchi or rales.  Abdominal:     General: Bowel sounds are normal.     Palpations: Abdomen is soft.     Tenderness: There is no abdominal tenderness.  Musculoskeletal:     Right lower leg: No edema.     Left lower leg: No edema.  Skin:    General: Skin is warm and dry.  Neurological:     Mental Status: She is alert and oriented to person, place, and time.  Psychiatric:        Mood and Affect: Mood normal.        Behavior: Behavior normal.     No results found for this or any previous visit (from the past 24 hour(s)).  No results found.   ASSESSMENT and PLAN  Problem List Items Addressed This Visit    None    Visit Diagnoses    Encounter for immunization    -  Primary   Relevant Orders   Flu Vaccine QUAD 36+ mos IM (Completed)   Essential hypertension       Relevant Medications   amLODipine (NORVASC) 5 MG tablet Stable on current regimen Will follow up with lab results At goal< 140/90   Other Relevant Orders   Lipid panel   CMP14+EGFR   Skin yeast infection       Relevant Medications   nystatin cream (MYCOSTATIN) Apply as needed Keep area clean and dry   Encounter for hepatitis C screening test for low risk patient       Relevant Orders   Hepatitis C antibody   Constipation, unspecified constipation type       Relevant Medications   polyethylene glycol powder (GLYCOLAX/MIRALAX) 17 GM/SCOOP powder Use daily as needed for regular BM R/se/b discussed    Needs to schedule pap.  Return in about 6 months (around 04/14/2021).   Huston Foley Jahid Weida, FNP-BC Primary Care at [place], [place] [address] Ph.  [phone] Fax [phone]  I have reviewed and agree with above  documentation. Agustina Caroli, MD

## 2020-10-14 NOTE — Patient Instructions (Addendum)
Constipation, Adult Constipation is when a person:  Poops (has a bowel movement) fewer times in a week than normal.  Has a hard time pooping.  Has poop that is dry, hard, or bigger than normal. Follow these instructions at home: Eating and drinking   Eat foods that have a lot of fiber, such as: ? Fresh fruits and vegetables. ? Whole grains. ? Beans.  Eat less of foods that are high in fat, low in fiber, or overly processed, such as: ? [place] fries. ? Hamburgers. ? Cookies. ? Candy. ? Soda.  Drink enough fluid to keep your pee (urine) clear or pale yellow. General instructions  Exercise regularly or as told by your doctor.  Go to the restroom when you feel like you need to poop. Do not hold it in.  Take over-the-counter and prescription medicines only as told by your doctor. These include any fiber supplements.  Do pelvic floor retraining exercises, such as: ? Doing deep breathing while relaxing your lower belly (abdomen). ? Relaxing your pelvic floor while pooping.  Watch your condition for any changes.  Keep all follow-up visits as told by your doctor. This is important. Contact a doctor if:  You have pain that gets worse.  You have a fever.  You have not pooped for 4 days.  You throw up (vomit).  You are not hungry.  You lose weight.  You are bleeding from the anus.  You have thin, pencil-like poop (stool). Get help right away if:  You have a fever, and your symptoms suddenly get worse.  You leak poop or have blood in your poop.  Your belly feels hard or bigger than normal (is bloated).  You have very bad belly pain.  You feel dizzy or you faint. This information is not intended to replace advice given to you by your health care provider. Make sure you discuss any questions you have with your health care provider. Document Revised: 10/08/2017 Document Reviewed: 04/15/2016 Elsevier Patient Education  2020 [place]  Maintenance, Female Adopting a healthy lifestyle and getting preventive care are important in promoting health and wellness. Ask your health care provider about:  The right schedule for you to have regular tests and exams.  Things you can do on your own to prevent diseases and keep yourself healthy. What should I know about diet, weight, and exercise? Eat a healthy diet   Eat a diet that includes plenty of vegetables, fruits, low-fat dairy products, and lean protein.  Do not eat a lot of foods that are high in solid fats, added sugars, or sodium. Maintain a healthy weight Body mass index (BMI) is used to identify weight problems. It estimates body fat based on height and weight. Your health care provider can help determine your BMI and help you achieve or maintain a healthy weight. Get regular exercise Get regular exercise. This is one of the most important things you can do for your health. Most adults should:  Exercise for at least 150 minutes each week. The exercise should increase your heart rate and make you sweat (moderate-intensity exercise).  Do strengthening exercises at least twice a week. This is in addition to the moderate-intensity exercise.  Spend less time sitting. Even light physical activity can be beneficial. Watch cholesterol and blood lipids Have your blood tested for lipids and cholesterol at 53 years of age, then have this test every 5 years. Have your cholesterol levels checked more often if:  Your lipid or  cholesterol levels are high.  You are older than 53 years of age.  You are at high risk for heart disease. What should I know about cancer screening? Depending on your health history and family history, you may need to have cancer screening at various ages. This may include screening for:  Breast cancer.  Cervical cancer.  Colorectal cancer.  Skin cancer.  Lung cancer. What should I know about heart disease, diabetes, and high blood  pressure? Blood pressure and heart disease  High blood pressure causes heart disease and increases the risk of stroke. This is more likely to develop in people who have high blood pressure readings, are of African descent, or are overweight.  Have your blood pressure checked: ? Every 3-5 years if you are 70-86 years of age. ? Every year if you are 75 years old or older. Diabetes Have regular diabetes screenings. This checks your fasting blood sugar level. Have the screening done:  Once every three years after age 23 if you are at a normal weight and have a low risk for diabetes.  More often and at a younger age if you are overweight or have a high risk for diabetes. What should I know about preventing infection? Hepatitis B If you have a higher risk for hepatitis B, you should be screened for this virus. Talk with your health care provider to find out if you are at risk for hepatitis B infection. Hepatitis C Testing is recommended for:  Everyone born from [birth year] through 1964.  Anyone with known risk factors for hepatitis C. Sexually transmitted infections (STIs)  Get screened for STIs, including gonorrhea and chlamydia, if: ? You are sexually active and are younger than 53 years of age. ? You are older than 52 years of age and your health care provider tells you that you are at risk for this type of infection. ? Your sexual activity has changed since you were last screened, and you are at increased risk for chlamydia or gonorrhea. Ask your health care provider if you are at risk.  Ask your health care provider about whether you are at high risk for HIV. Your health care provider may recommend a prescription medicine to help prevent HIV infection. If you choose to take medicine to prevent HIV, you should first get tested for HIV. You should then be tested every 3 months for as long as you are taking the medicine. Pregnancy  If you are about to stop having your period (premenopausal) and  you may become pregnant, seek counseling before you get pregnant.  Take 400 to 800 micrograms (mcg) of folic acid every day if you become pregnant.  Ask for birth control (contraception) if you want to prevent pregnancy. Osteoporosis and menopause Osteoporosis is a disease in which the bones lose minerals and strength with aging. This can result in bone fractures. If you are 6 years old or older, or if you are at risk for osteoporosis and fractures, ask your health care provider if you should:  Be screened for bone loss.  Take a calcium or vitamin D supplement to lower your risk of fractures.  Be given hormone replacement therapy (HRT) to treat symptoms of menopause. Follow these instructions at home: Lifestyle  Do not use any products that contain nicotine or tobacco, such as cigarettes, e-cigarettes, and chewing tobacco. If you need help quitting, ask your health care provider.  Do not use street drugs.  Do not share needles.  Ask your health care provider for  help if you need support or information about quitting drugs. Alcohol use  Do not drink alcohol if: ? Your health care provider tells you not to drink. ? You are pregnant, may be pregnant, or are planning to become pregnant.  If you drink alcohol: ? Limit how much you use to 0-1 drink a day. ? Limit intake if you are breastfeeding.  Be aware of how much alcohol is in your drink. In the [place] one drink equals one 12 oz bottle of beer (355 mL), one 5 oz glass of wine (148 mL), or one 1 oz glass of hard liquor (44 mL). General instructions  Schedule regular health, dental, and eye exams.  Stay current with your vaccines.  Tell your health care provider if: ? You often feel depressed. ? You have ever been abused or do not feel safe at home. Summary  Adopting a healthy lifestyle and getting preventive care are important in promoting health and wellness.  Follow your health care provider's instructions about healthy  diet, exercising, and getting tested or screened for diseases.  Follow your health care provider's instructions on monitoring your cholesterol and blood pressure. This information is not intended to replace advice given to you by your health care provider. Make sure you discuss any questions you have with your health care provider. Document Revised: 10/19/2018 Document Reviewed: 10/19/2018 Elsevier Patient Education  [employer].    If you have lab work done today you will be contacted with your lab results within the next 2 weeks.  If you have not heard from [place] then please contact us. The fastest way to get your results is to register for My Chart.   IF you received an x-ray today, you will receive an invoice from [facility]. Please contact [facility] at [phone] with questions or concerns regarding your invoice.   IF you received labwork today, you will receive an invoice from [facility]. Please contact [facility] at [phone] with questions or concerns regarding your invoice.   Our billing staff will not be able to assist you with questions regarding bills from these companies.  You will be contacted with the lab results as soon as they are available. The fastest way to get your results is to activate your My Chart account. Instructions are located on the last page of this paperwork. If you have not heard from [place] regarding the results in 2 weeks, please contact this office.

## 2020-10-15 ENCOUNTER — Other Ambulatory Visit: Payer: Self-pay | Admitting: Family Medicine

## 2020-10-15 DIAGNOSIS — E782 Mixed hyperlipidemia: Secondary | ICD-10-CM

## 2020-10-15 LAB — CMP14+EGFR
ALT: 16 IU/L (ref 0–32)
AST: 18 IU/L (ref 0–40)
Albumin/Globulin Ratio: 1.4 (ref 1.2–2.2)
Albumin: 4.4 g/dL (ref 3.8–4.9)
Alkaline Phosphatase: 92 IU/L (ref 44–121)
BUN/Creatinine Ratio: 17 (ref 9–23)
BUN: 13 mg/dL (ref 6–24)
Bilirubin Total: 0.3 mg/dL (ref 0.0–1.2)
CO2: 23 mmol/L (ref 20–29)
Calcium: 9.5 mg/dL (ref 8.7–10.2)
Chloride: 104 mmol/L (ref 96–106)
Creatinine, Ser: 0.77 mg/dL (ref 0.57–1.00)
GFR calc Af Amer: 102 mL/min/{1.73_m2} (ref >59)
GFR calc non Af Amer: 88 mL/min/{1.73_m2} (ref >59)
Globulin, Total: 3.1 g/dL (ref 1.5–4.5)
Glucose: 106 mg/dL — ABNORMAL HIGH (ref 65–99)
Potassium: 4.2 mmol/L (ref 3.5–5.2)
Sodium: 140 mmol/L (ref 134–144)
Total Protein: 7.5 g/dL (ref 6.0–8.5)

## 2020-10-15 LAB — LIPID PANEL
Chol/HDL Ratio: 4 ratio (ref 0.0–4.4)
Cholesterol, Total: 233 mg/dL — ABNORMAL HIGH (ref 100–199)
HDL: 58 mg/dL (ref >39)
LDL Chol Calc (NIH): 167 mg/dL — ABNORMAL HIGH (ref 0–99)
Triglycerides: 50 mg/dL (ref 0–149)
VLDL Cholesterol Cal: 8 mg/dL (ref 5–40)

## 2020-10-15 LAB — HEPATITIS C ANTIBODY: Hep C Virus Ab: <0.1 s/co ratio (ref 0.0–0.9)

## 2020-10-15 MED ORDER — ROSUVASTATIN CALCIUM 10 MG PO TABS: 10.0000 mg | ORAL_TABLET | Freq: Every day | ORAL | 3 refills | Status: DC

## 2020-10-15 NOTE — Progress Notes (Signed)
Hello if you could let Vicki French know her cholesterol is quite elevated. I am going to have her start daily crestor. I sent this medication to her pharmacy. Thanks!

## 2020-10-22 ENCOUNTER — Encounter: Payer: Self-pay | Admitting: Radiology

## 2020-12-16 ENCOUNTER — Ambulatory Visit: Payer: BC Managed Care – PPO

## 2021-01-24 ENCOUNTER — Telehealth: Payer: Self-pay | Admitting: Family Medicine

## 2021-01-24 NOTE — Telephone Encounter (Signed)
01/24/2021 - PATIENT HAS A TRANSFER OF CARE APPOINTMENT SCHEDULED WITH KELSEA JUST ON Monday 01/27/2021 AT 9:00 am. KELSEA WAS ALSO GOING TO DO HER PAP SMEAR. I TRIED TO CALL THE PATIENT TO LET HER KNOW ABOUT THE TOC BUT THAT KELSEA COULD STILL DO HER PAP SMEAR HOWEVER, I HAD TO LEAVE A VOICE MAIL TO RETURN MY CALL. [place]

## 2021-01-27 ENCOUNTER — Ambulatory Visit (INDEPENDENT_AMBULATORY_CARE_PROVIDER_SITE_OTHER): Payer: BC Managed Care – PPO | Admitting: Family Medicine

## 2021-01-27 ENCOUNTER — Encounter: Payer: Self-pay | Admitting: Family Medicine

## 2021-01-27 ENCOUNTER — Inpatient Hospital Stay: Admission: RE | Admit: 2021-01-27 | Payer: BC Managed Care – PPO | Source: Ambulatory Visit

## 2021-01-27 ENCOUNTER — Other Ambulatory Visit: Payer: Self-pay

## 2021-01-27 ENCOUNTER — Other Ambulatory Visit (HOSPITAL_COMMUNITY)
Admission: RE | Admit: 2021-01-27 | Discharge: 2021-01-27 | Disposition: A | Discharge status: Home / Self Care | Payer: BC Managed Care – PPO | Source: Ambulatory Visit | Attending: Family Medicine | Admitting: Family Medicine

## 2021-01-27 VITALS — BP 131/85 | HR 87 | Temp 97.9°F | Ht 67.5 in | Wt 231.0 lb

## 2021-01-27 DIAGNOSIS — B372 Candidiasis of skin and nail: Secondary | ICD-10-CM | POA: Diagnosis not present

## 2021-01-27 DIAGNOSIS — K649 Unspecified hemorrhoids: Secondary | ICD-10-CM | POA: Diagnosis not present

## 2021-01-27 DIAGNOSIS — Z113 Encounter for screening for infections with a predominantly sexual mode of transmission: Secondary | ICD-10-CM | POA: Diagnosis not present

## 2021-01-27 DIAGNOSIS — Z124 Encounter for screening for malignant neoplasm of cervix: Secondary | ICD-10-CM

## 2021-01-27 DIAGNOSIS — I10 Essential (primary) hypertension: Secondary | ICD-10-CM | POA: Diagnosis not present

## 2021-01-27 DIAGNOSIS — E782 Mixed hyperlipidemia: Secondary | ICD-10-CM

## 2021-01-27 MED ORDER — NYSTATIN 100000 UNIT/GM EX CREA: 1.0000 "application " | TOPICAL_CREAM | Freq: Two times a day (BID) | CUTANEOUS | 1 refills | Status: DC

## 2021-01-27 MED ORDER — AMLODIPINE BESYLATE 5 MG PO TABS: 5.0000 mg | ORAL_TABLET | Freq: Every day | ORAL | 2 refills | Status: AC

## 2021-01-27 MED ORDER — ROSUVASTATIN CALCIUM 10 MG PO TABS: 10.0000 mg | ORAL_TABLET | Freq: Every day | ORAL | 3 refills | Status: DC

## 2021-01-27 NOTE — Progress Notes (Signed)
3/21/20229:31 AM  Vicki French 05/17/1966, 55 y.o., female [id number]  Chief Complaint  Patient presents with  . Hypertension  . Gynecologic Exam  . Hemorrhoids  . Hyperlipidemia    Discuss meds    HPI:   Patient is a 55 y.o. female with past medical history significant for HTN and prediabetes who presents today for routine follow-up.  Have had abnormal Pap in the past Had 2 menstrual cycles in Jan None since Mother went thru menopause at age 45  Has been wanting a breast reduction Gets rash under her breasts Keeps clean and dry as able Was given nystatin last OV Has been doing better Has a mammogram at 72  Has been having issues with hemorrhoids Uses preparation H Constipation better, using fiber daily   Hypertension Daily amlodipine Denies CP, SOB, palpitations, leg swelling BP Readings from Last 3 Encounters:  01/27/21 131/85  10/14/20 135/89  04/15/20 131/84   HLD Last OV started daily crestor No issues with this medication Lab Results  Component Value Date   CHOL 233 (H) 10/14/2020   HDL 58 10/14/2020   LDLCALC 167 (H) 10/14/2020   TRIG 50 10/14/2020   CHOLHDL 4.0 10/14/2020  The 10-year ASCVD risk score Mikey Bussing DC Jr., et al., 2013) is: 5.4%   Values used to calculate the score:     Age: 39 years     Sex: Female     Is Non-Hispanic African American: Yes     Diabetic: No     Tobacco smoker: No     Systolic Blood Pressure: 814 mmHg     Is BP treated: Yes     HDL Cholesterol: 58 mg/dL     Total Cholesterol: 233 mg/dL  Health Maintenance  Topic Date Due  . PAP SMEAR-Modifier  09/01/2020  . MAMMOGRAM  12/05/2021  . TETANUS/TDAP  09/02/2027  . COLONOSCOPY (Pts 45-27yrs Insurance coverage will need to be confirmed)  05/21/2029  . INFLUENZA VACCINE  Completed  . COVID-19 Vaccine  Completed  . Hepatitis C Screening  Completed  . HIV Screening  Completed  . HPV VACCINES  Aged Out     Depression screen [facility] 2/9 01/27/2021 10/14/2020 10/14/2020   Decreased Interest 0 0 0  Down, Depressed, Hopeless 0 0 0  PHQ - 2 Score 0 0 0    Fall Risk  01/27/2021 10/14/2020 10/14/2020 04/15/2020 04/17/2019  Falls in the past year? 0 0 0 0 0  Number falls in past yr: 0 0 0 0 0  Injury with Fall? 0 0 0 0 0  Follow up Falls evaluation completed Falls evaluation completed Falls evaluation completed - -     No Known Allergies  Prior to Admission medications   Medication Sig Start Date End Date Taking? Authorizing Provider  acetaminophen (TYLENOL) 325 MG tablet Take 650 mg by mouth every 6 (six) hours as needed.   Yes [provider]  amLODipine (NORVASC) 5 MG tablet Take 1 tablet (5 mg total) by mouth daily. 04/15/20  Yes Rutherford Guys, MD  nystatin cream (MYCOSTATIN) Apply 1 application topically 2 (two) times daily. Patient not taking: Reported on 10/14/2020 10/23/19   Rutherford Guys, MD    Past Medical History:  Diagnosis Date  . Allergy   . Arthritis    left ankle  . Atrial flutter ([place])    ruled out by Dr Rockey Situ  . Fibroid tumor 2007   removed from uterus   . Hypertension   . Nipple discharge  in female    left  . Prediabetes     Past Surgical History:  Procedure Laterality Date  . BREAST BIOPSY Left 09/27/2015   U/S Core  . BREAST DUCTAL SYSTEM EXCISION Left 07/23/2017   Procedure: LEFT BREAST SUBAREOLAR DUCTAL EXCISION;  Surgeon: Jovita Kussmaul, MD;  Location: [facility];  Service: General;  Laterality: Left;  . BREAST EXCISIONAL BIOPSY    . UTERINE FIBROID SURGERY      Social History   Tobacco Use  . Smoking status: Never Smoker  . Smokeless tobacco: Never Used  Substance Use Topics  . Alcohol use: No    Family History  Problem Relation Age of Onset  . Hypertension Mother   . Hyperlipidemia Mother   . Diabetes Mother   . Hypertension Father   . Hyperlipidemia Father   . Cancer Father     Review of Systems  Constitutional: Negative for chills, fever and malaise/fatigue.  Eyes: Negative  for blurred vision and double vision.  Respiratory: Negative for cough, shortness of breath and wheezing.   Cardiovascular: Negative for chest pain, palpitations and leg swelling.  Gastrointestinal: Positive for constipation. Negative for abdominal pain, blood in stool, diarrhea, heartburn, nausea and vomiting.  Genitourinary: Negative for dysuria, frequency and hematuria.  Musculoskeletal: Negative for back pain and joint pain.  Skin: Positive for rash (under bilateral breasts).  Neurological: Negative for dizziness, weakness and headaches.     OBJECTIVE:  Today's Vitals   01/27/21 0913  BP: 131/85  Pulse: 87  Temp: 97.9 F (36.6 C)  SpO2: 98%  Weight: 231 lb (104.8 kg)  Height: 5' 7.5" (1.715 m)   Body mass index is 35.65 kg/m.   Physical Exam Constitutional:      General: She is not in acute distress.    Appearance: Normal appearance. She is not ill-appearing.  HENT:     Head: Normocephalic.  Cardiovascular:     Rate and Rhythm: Normal rate and regular rhythm.     Pulses: Normal pulses.     Heart sounds: Normal heart sounds. No murmur heard. No friction rub. No gallop.   Pulmonary:     Effort: Pulmonary effort is normal. No respiratory distress.     Breath sounds: Normal breath sounds. No stridor. No wheezing, rhonchi or rales.  Abdominal:     General: Bowel sounds are normal.     Palpations: Abdomen is soft.     Tenderness: There is no abdominal tenderness.  Musculoskeletal:     Right lower leg: No edema.     Left lower leg: No edema.  Skin:    General: Skin is warm and dry.  Neurological:     Mental Status: She is alert and oriented to person, place, and time.  Psychiatric:        Mood and Affect: Mood normal.        Behavior: Behavior normal.   Pelvic exam: normal external genitalia, vulva, vagina, cervix, uterus and adnexa, VULVA: normal appearing vulva with no masses, tenderness or lesions, VAGINA: normal appearing vagina with normal color and  discharge, no lesions, CERVIX: normal appearing cervix without discharge or lesions, UTERUS: uterus is normal size, shape, consistency and nontender, ADNEXA: normal adnexa in size, nontender and no masses, PAP: Pap smear done today, thin-prep method, DNA probe for chlamydia and GC obtained, HPV test, exam chaperoned by [facility], LPN.   No results found for this or any previous visit (from the past 24 hour(s)).  No results found.  ASSESSMENT and PLAN  Problem List Items Addressed This Visit   None   Visit Diagnoses    Mixed hyperlipidemia    -  Primary   Relevant Medications   amLODipine (NORVASC) 5 MG tablet   rosuvastatin (CRESTOR) 10 MG tablet   Essential hypertension       Relevant Medications   amLODipine (NORVASC) 5 MG tablet   rosuvastatin (CRESTOR) 10 MG tablet   Skin yeast infection       Relevant Medications   nystatin cream (MYCOSTATIN)   Hemorrhoids, unspecified hemorrhoid type       Relevant Medications   amLODipine (NORVASC) 5 MG tablet   rosuvastatin (CRESTOR) 10 MG tablet   Pap smear for cervical cancer screening       Relevant Orders   Cytology - PAP([facility])        Plan  Will follow up with pap results  Medication refills sent  Chronic conditions stable on current regimens   Return in about 6 months (around 07/30/2021).   Huston Foley Tobechukwu Emmick, FNP-BC [facility] [place], [place] [address] Ph.  [phone] Fax [phone]

## 2021-01-27 NOTE — Patient Instructions (Addendum)
  Health Maintenance, Female Adopting a healthy lifestyle and getting preventive care are important in promoting health and wellness. Ask your health care provider about:  The right schedule for you to have regular tests and exams.  Things you can do on your own to prevent diseases and keep yourself healthy. What should I know about diet, weight, and exercise? Eat a healthy diet  Eat a diet that includes plenty of vegetables, fruits, low-fat dairy products, and lean protein.  Do not eat a lot of foods that are high in solid fats, added sugars, or sodium.   Maintain a healthy weight Body mass index (BMI) is used to identify weight problems. It estimates body fat based on height and weight. Your health care provider can help determine your BMI and help you achieve or maintain a healthy weight. Get regular exercise Get regular exercise. This is one of the most important things you can do for your health. Most adults should:  Exercise for at least 150 minutes each week. The exercise should increase your heart rate and make you sweat (moderate-intensity exercise).  Do strengthening exercises at least twice a week. This is in addition to the moderate-intensity exercise.  Spend less time sitting. Even light physical activity can be beneficial. Watch cholesterol and blood lipids Have your blood tested for lipids and cholesterol at 54 years of age, then have this test every 5 years. Have your cholesterol levels checked more often if:  Your lipid or cholesterol levels are high.  You are older than 54 years of age.  You are at high risk for heart disease. What should I know about cancer screening? Depending on your health history and family history, you may need to have cancer screening at various ages. This may include screening for:  Breast cancer.  Cervical cancer.  Colorectal cancer.  Skin cancer.  Lung cancer. What should I know about heart disease, diabetes, and high blood  pressure? Blood pressure and heart disease  High blood pressure causes heart disease and increases the risk of stroke. This is more likely to develop in people who have high blood pressure readings, are of African descent, or are overweight.  Have your blood pressure checked: ? Every 3-5 years if you are 18-39 years of age. ? Every year if you are 40 years old or older. Diabetes Have regular diabetes screenings. This checks your fasting blood sugar level. Have the screening done:  Once every three years after age 40 if you are at a normal weight and have a low risk for diabetes.  More often and at a younger age if you are overweight or have a high risk for diabetes. What should I know about preventing infection? Hepatitis B If you have a higher risk for hepatitis B, you should be screened for this virus. Talk with your health care provider to find out if you are at risk for hepatitis B infection. Hepatitis C Testing is recommended for:  Everyone born from 1945 through 1965.  Anyone with known risk factors for hepatitis C. Sexually transmitted infections (STIs)  Get screened for STIs, including gonorrhea and chlamydia, if: ? You are sexually active and are younger than 54 years of age. ? You are older than 54 years of age and your health care provider tells you that you are at risk for this type of infection. ? Your sexual activity has changed since you were last screened, and you are at increased risk for chlamydia or gonorrhea. Ask your health   care provider if you are at risk.  Ask your health care provider about whether you are at high risk for HIV. Your health care provider may recommend a prescription medicine to help prevent HIV infection. If you choose to take medicine to prevent HIV, you should first get tested for HIV. You should then be tested every 3 months for as long as you are taking the medicine. Pregnancy  If you are about to stop having your period (premenopausal) and  you may become pregnant, seek counseling before you get pregnant.  Take 400 to 800 micrograms (mcg) of folic acid every day if you become pregnant.  Ask for birth control (contraception) if you want to prevent pregnancy. Osteoporosis and menopause Osteoporosis is a disease in which the bones lose minerals and strength with aging. This can result in bone fractures. If you are 65 years old or older, or if you are at risk for osteoporosis and fractures, ask your health care provider if you should:  Be screened for bone loss.  Take a calcium or vitamin D supplement to lower your risk of fractures.  Be given hormone replacement therapy (HRT) to treat symptoms of menopause. Follow these instructions at home: Lifestyle  Do not use any products that contain nicotine or tobacco, such as cigarettes, e-cigarettes, and chewing tobacco. If you need help quitting, ask your health care provider.  Do not use street drugs.  Do not share needles.  Ask your health care provider for help if you need support or information about quitting drugs. Alcohol use  Do not drink alcohol if: ? Your health care provider tells you not to drink. ? You are pregnant, may be pregnant, or are planning to become pregnant.  If you drink alcohol: ? Limit how much you use to 0-1 drink a day. ? Limit intake if you are breastfeeding.  Be aware of how much alcohol is in your drink. In the [place] one drink equals one 12 oz bottle of beer (355 mL), one 5 oz glass of wine (148 mL), or one 1 oz glass of hard liquor (44 mL). General instructions  Schedule regular health, dental, and eye exams.  Stay current with your vaccines.  Tell your health care provider if: ? You often feel depressed. ? You have ever been abused or do not feel safe at home. Summary  Adopting a healthy lifestyle and getting preventive care are important in promoting health and wellness.  Follow your health care provider's instructions about healthy  diet, exercising, and getting tested or screened for diseases.  Follow your health care provider's instructions on monitoring your cholesterol and blood pressure. This information is not intended to replace advice given to you by your health care provider. Make sure you discuss any questions you have with your health care provider. Document Revised: 10/19/2018 Document Reviewed: 10/19/2018 Elsevier Patient Education  2021 [employer]vier Inc[employer]   If you have lab work done today you will be contacted with your lab results within the next 2 weeks.  If you have not heard from us then please contact us. The fastest way to get your results is to register for My Chart.   IF you received an x-ray today, you will receive an invoice from [facility]. Please contact [facility] at [phone] with questions or concerns regarding your invoice.   IF you received labwork today, you will receive an invoice from [facility]. Please contact [facility] at [phone] with questions or concerns regarding your invoice.   Our billing   staff will not be able to assist you with questions regarding bills from these companies.  You will be contacted with the lab results as soon as they are available. The fastest way to get your results is to activate your My Chart account. Instructions are located on the last page of this paperwork. If you have not heard from us regarding the results in 2 weeks, please contact this office.      

## 2021-01-30 LAB — CYTOLOGY - PAP
Chlamydia: NEGATIVE
Comment: NEGATIVE
Comment: NEGATIVE
Comment: NEGATIVE
Comment: NEGATIVE
Comment: NORMAL
Diagnosis: NEGATIVE
HSV1: NEGATIVE
HSV2: NEGATIVE
High risk HPV: NEGATIVE
Neisseria Gonorrhea: NEGATIVE
Trichomonas: NEGATIVE

## 2021-02-01 ENCOUNTER — Inpatient Hospital Stay: Admission: RE | Admit: 2021-02-01 | Payer: BC Managed Care – PPO | Source: Ambulatory Visit

## 2021-03-31 ENCOUNTER — Other Ambulatory Visit: Payer: Self-pay

## 2021-03-31 ENCOUNTER — Ambulatory Visit
Admission: RE | Admit: 2021-03-31 | Discharge: 2021-03-31 | Disposition: A | Discharge status: Home / Self Care | Payer: BC Managed Care – PPO | Source: Ambulatory Visit | Attending: General Surgery | Admitting: General Surgery

## 2021-03-31 DIAGNOSIS — Z1231 Encounter for screening mammogram for malignant neoplasm of breast: Secondary | ICD-10-CM

## 2021-04-21 ENCOUNTER — Ambulatory Visit: Payer: BC Managed Care – PPO | Admitting: Family Medicine

## 2022-03-24 ENCOUNTER — Other Ambulatory Visit: Payer: Self-pay | Admitting: General Surgery

## 2022-03-24 DIAGNOSIS — Z1231 Encounter for screening mammogram for malignant neoplasm of breast: Secondary | ICD-10-CM

## 2022-04-03 ENCOUNTER — Ambulatory Visit: Payer: BC Managed Care – PPO

## 2022-04-07 ENCOUNTER — Ambulatory Visit
Admission: RE | Admit: 2022-04-07 | Discharge: 2022-04-07 | Disposition: A | Discharge status: Home / Self Care | Payer: BC Managed Care – PPO | Source: Ambulatory Visit | Attending: General Surgery | Admitting: General Surgery

## 2022-04-07 DIAGNOSIS — Z1231 Encounter for screening mammogram for malignant neoplasm of breast: Secondary | ICD-10-CM

## 2022-11-11 LAB — COLOGUARD: COLOGUARD: NEGATIVE

## 2023-02-05 ENCOUNTER — Encounter: Payer: Self-pay | Admitting: Plastic Surgery

## 2023-02-05 ENCOUNTER — Ambulatory Visit (INDEPENDENT_AMBULATORY_CARE_PROVIDER_SITE_OTHER): Payer: BC Managed Care – PPO | Admitting: Plastic Surgery

## 2023-02-05 VITALS — BP 139/86 | HR 93 | Ht 67.5 in | Wt 233.4 lb

## 2023-02-05 DIAGNOSIS — M546 Pain in thoracic spine: Secondary | ICD-10-CM

## 2023-02-05 DIAGNOSIS — M545 Low back pain, unspecified: Secondary | ICD-10-CM

## 2023-02-05 DIAGNOSIS — M542 Cervicalgia: Secondary | ICD-10-CM | POA: Diagnosis not present

## 2023-02-05 DIAGNOSIS — N62 Hypertrophy of breast: Secondary | ICD-10-CM

## 2023-02-05 DIAGNOSIS — M549 Dorsalgia, unspecified: Secondary | ICD-10-CM | POA: Insufficient documentation

## 2023-02-05 DIAGNOSIS — G8929 Other chronic pain: Secondary | ICD-10-CM

## 2023-02-05 DIAGNOSIS — R21 Rash and other nonspecific skin eruption: Secondary | ICD-10-CM

## 2023-02-05 NOTE — Progress Notes (Addendum)
Patient ID: Vicki French, female    DOB: [birth year], 57 y.o.   MRN: [id number]   Chief Complaint  Patient presents with   Consult   Breast Problem    Mammary Hyperplasia: The patient is a 57 y.o. female with a history of mammary hyperplasia for several years.  She has extremely large breasts causing symptoms that include the following: Back pain in the upper and lower back, including neck pain. She pulls or pins her bra straps to provide better lift and relief of the pressure and pain. She notices relief by holding her breast up manually.  Her shoulder straps cause grooves and pain and pressure that requires padding for relief. Pain medication is sometimes required with motrin and tylenol.  Activities that are hindered by enlarged breasts include: exercise and running.  She has tried supportive clothing as well as fitted bras without improvement.  Her breasts are extremely large and fairly symmetric.  She has hyperpigmentation of the inframammary area on both sides.  The sternal to nipple distance on the right is 43 cm and the left is 43 cm.  The IMF distance is 20 cm.  She is 5 feet 7 inches tall and weighs 231 pounds.  The BMI = 36.2 kg/m.  Preoperative bra size = H cup. She would like to be a C cup. The estimated excess breast tissue to be removed at the time of surgery = 1000 grams from both breasts.  Mammogram history: 5/23 negative.  Family history of breast cancer:  none.  Tobacco use: None.   The patient expresses the desire to pursue surgical intervention.  Her last hemoglobin A1c a few months ago was 6.3.     Review of Systems  Constitutional: Negative.   HENT: Negative.    Eyes: Negative.   Respiratory: Negative.    Cardiovascular: Negative.   Gastrointestinal: Negative.   Endocrine: Negative.   Genitourinary: Negative.   Musculoskeletal:  Positive for back pain and neck pain.  Skin:  Positive for rash.    Past Medical History:  Diagnosis Date   Allergy    Arthritis     left ankle   Atrial flutter (HCC)    ruled out by Dr Mariah Milling   Fibroid tumor 2007   removed from uterus    Hypertension    Nipple discharge in female    left   Prediabetes     Past Surgical History:  Procedure Laterality Date   BREAST BIOPSY Left 09/27/2015   U/S Core   BREAST DUCTAL SYSTEM EXCISION Left 07/23/2017   Procedure: LEFT BREAST SUBAREOLAR DUCTAL EXCISION;  Surgeon: Griselda Miner, MD;  Location: [facility];  Service: General;  Laterality: Left;   BREAST EXCISIONAL BIOPSY     UTERINE FIBROID SURGERY        Current Outpatient Medications:    acetaminophen (TYLENOL) 325 MG tablet, Take 650 mg by mouth every 6 (six) hours as needed., Disp: , Rfl:    amLODipine (NORVASC) 5 MG tablet, Take 1 tablet (5 mg total) by mouth daily., Disp: 90 tablet, Rfl: 2   nystatin cream (MYCOSTATIN), Apply 1 application topically 2 (two) times daily., Disp: 30 g, Rfl: 1   polyethylene glycol powder (GLYCOLAX/MIRALAX) 17 GM/SCOOP powder, Take 17 g by mouth daily., Disp: 3350 g, Rfl: 1   rosuvastatin (CRESTOR) 10 MG tablet, Take 1 tablet (10 mg total) by mouth daily., Disp: 90 tablet, Rfl: 3   Vitamin D, Ergocalciferol, (DRISDOL) 1.25 MG (50000  UNIT) CAPS capsule, Take 50,000 Units by mouth once a week., Disp: , Rfl:    Objective:   Vitals:   02/05/23 0853  BP: 139/86  Pulse: 93  SpO2: 97%    Physical Exam Vitals and nursing note reviewed.  Constitutional:      Appearance: Normal appearance.  HENT:     Head: Normocephalic and atraumatic.  Cardiovascular:     Rate and Rhythm: Normal rate.     Pulses: Normal pulses.  Pulmonary:     Effort: Pulmonary effort is normal.  Abdominal:     General: There is no distension.     Palpations: Abdomen is soft.     Tenderness: There is no abdominal tenderness.  Musculoskeletal:        General: No swelling or deformity.  Skin:    General: Skin is warm.     Capillary Refill: Capillary refill takes less than 2 seconds.      Coloration: Skin is not jaundiced.     Findings: No bruising.  Neurological:     Mental Status: She is alert and oriented to person, place, and time.  Psychiatric:        Mood and Affect: Mood normal.        Behavior: Behavior normal.        Thought Content: Thought content normal.        Judgment: Judgment normal.     Assessment & Plan:  Hypertrophy of breast - Plan: Ambulatory referral to Physical Therapy  Symptomatic mammary hypertrophy  Chronic bilateral thoracic back pain  Neck pain  The procedure the patient selected and that was best for the patient was discussed. The risk were discussed and include but not limited to the following:  Breast asymmetry, fluid accumulation, firmness of the breast, inability to breast feed, loss of nipple or areola, skin loss, change in skin and nipple sensation, fat necrosis of the breast tissue, bleeding, infection and healing delay.  There are risks of anesthesia and injury to nerves or blood vessels.  Allergic reaction to tape, suture and skin glue are possible.  There will be swelling.  Any of these can lead to the need for revisional surgery.  A breast reduction has potential to interfere with diagnostic procedures in the future.  This procedure is best done when the breast is fully developed.  Changes in the breast will continue to occur over time: pregnancy, weight gain or weigh loss.    Total time: 40 minutes. This includes time spent with the patient during the visit as well as time spent before and after the visit reviewing the chart, documenting the encounter, ordering pertinent studies and literature for the patient.   Physical therapy:  ordered Mammogram:  will need before surgery  The patient is a good candidate for bilateral breast reduction with possible liposuction.  She will need amputation technique.  We will see her back after she finishes PT and then we should be able to submit to insurance for her for preauthorization.  Pictures  were obtained of the patient and placed in the chart with the patient's or guardian's permission.   Vicki Bills Caprice Mccaffrey, DO

## 2023-03-30 ENCOUNTER — Other Ambulatory Visit: Payer: Self-pay | Admitting: General Surgery

## 2023-03-30 DIAGNOSIS — Z1231 Encounter for screening mammogram for malignant neoplasm of breast: Secondary | ICD-10-CM

## 2023-05-05 ENCOUNTER — Other Ambulatory Visit: Payer: Self-pay

## 2023-05-05 ENCOUNTER — Ambulatory Visit: Payer: BC Managed Care – PPO | Attending: Plastic Surgery

## 2023-05-05 DIAGNOSIS — G8929 Other chronic pain: Secondary | ICD-10-CM | POA: Insufficient documentation

## 2023-05-05 DIAGNOSIS — M6281 Muscle weakness (generalized): Secondary | ICD-10-CM | POA: Diagnosis present

## 2023-05-05 DIAGNOSIS — M546 Pain in thoracic spine: Secondary | ICD-10-CM | POA: Insufficient documentation

## 2023-05-05 DIAGNOSIS — N62 Hypertrophy of breast: Secondary | ICD-10-CM | POA: Insufficient documentation

## 2023-05-05 DIAGNOSIS — M542 Cervicalgia: Secondary | ICD-10-CM | POA: Diagnosis present

## 2023-05-05 NOTE — Therapy (Signed)
OUTPATIENT PHYSICAL THERAPY CERVICAL EVALUATION   Patient Name: Vicki French MRN: [id number] DOB:06/24/1966, 57 y.o., female Today's Date: 05/05/2023  END OF SESSION:  PT End of Session - 05/05/23 1851     Visit Number 1    Number of Visits 7    Date for PT Re-Evaluation 06/16/23    Authorization Type BC[employer]    PT Start Time 1730    PT Stop Time 1810    PT Time Calculation (min) 40 min    Activity Tolerance Patient tolerated treatment well    Behavior During Therapy WFL for tasks assessed/performed             Past Medical History:  Diagnosis Date   Allergy    Arthritis    left ankle   Atrial flutter (HCC)    ruled out by Dr Mariah Milling   Fibroid tumor 2007   removed from uterus    Hypertension    Nipple discharge in female    left   Prediabetes    Past Surgical History:  Procedure Laterality Date   BREAST BIOPSY Left 09/27/2015   U/S Core   BREAST DUCTAL SYSTEM EXCISION Left 07/23/2017   Procedure: LEFT BREAST SUBAREOLAR DUCTAL EXCISION;  Surgeon: Griselda Miner, MD;  Location: [facility];  Service: General;  Laterality: Left;   BREAST EXCISIONAL BIOPSY     UTERINE FIBROID SURGERY     Patient Active Problem List   Diagnosis Date Noted   Back pain 02/05/2023   Neck pain 02/05/2023   Prediabetes 10/17/2018   Breast mass in female 11/15/2015   Nipple discharge in female 11/15/2015   Symptomatic mammary hypertrophy 11/15/2015   Abnormal EKG 02/02/2014   Obesity 02/02/2014   PCP: Peggye Form, DO  REFERRING PROVIDER: Peggye Form, DO  REFERRING DIAG: Hypertrophy of breast [N62], Symptomatic mammary hypertrophy [N62], Chronic bilateral thoracic back pain [M54.6, G89.29], Neck pain [M54.2]   THERAPY DIAG:  Cervicalgia  Pain in thoracic spine  Muscle weakness (generalized)  Rationale for Evaluation and Treatment: Rehabilitation  ONSET DATE: 2 years ago   SUBJECTIVE:                                                                                                                                                                                                          SUBJECTIVE STATEMENT: Patient reports to PT d/t bilateral shoulder pain with related to referring dx of Mammary Hyperplasia. She reports that she does also have some neck and upper back pain with minor lower back pain.  She endorses that her most relief comes from removing bra and end of day. She hasn't been walking and exercises as much. She has also tried use of supportive clothing as well as fitted bras without improvement.     PERTINENT HISTORY:  PMHx includes obesity, symptomatic mammary hypertrophy, and HTN  PAIN:  Are you having pain? Yes: NPRS scale: 5/10 Pain location: shoulders Pain description: aching Aggravating factors: work, constant pain  Relieving factors: removing bra at end of day, tylenol    PRECAUTIONS: None  WEIGHT BEARING RESTRICTIONS: No  FALLS:  Has patient fallen in last 6 months? No  LIVING ENVIRONMENT: Lives with: lives alone Lives in: House/apartment Stairs: No Has following equipment at home: None  OCCUPATION: Writer  PLOF: Independent  PATIENT GOALS: Patient would like to get surgery after finishes PT.   NEXT MD VISIT: to be scheduled following PT POC  OBJECTIVE:    PATIENT SURVEYS:  FOTO 51 current, 63 predicted   COGNITION: Overall cognitive status: Within functional limits for tasks assessed   POSTURE: rounded shoulders and forward head  PALPATION: Moderate tenderness to palpation along bilateral neck and shoulder    CERVICAL ROM:   Active ROM A/PROM (deg) eval  Flexion 100%  Extension 50%  Right lateral flexion 50%  Left lateral flexion 50%  Right rotation 75%  Left rotation 75%  Thoracic flexion  25%  Thoracic extension 25%   (Blank rows = not tested)  UPPER EXTREMITY ROM:  Active ROM Right eval Left eval  Shoulder flexion 170 170  Shoulder extension     Shoulder abduction [facility]  Shoulder adduction    Shoulder extension    Shoulder internal rotation    Shoulder external rotation    Elbow flexion    Elbow extension    Wrist flexion    Wrist extension    Wrist ulnar deviation    Wrist radial deviation    Wrist pronation    Wrist supination     (Blank rows = not tested)  UPPER EXTREMITY MMT:  MMT Right eval Left eval  Shoulder flexion 4- 3+  Shoulder extension    Shoulder abduction 4- 4-  Shoulder adduction    Shoulder extension    Shoulder internal rotation 4 4  Shoulder external rotation 4 4  Middle trapezius 3+ 3+  Lower trapezius 3+ 3+  Elbow flexion    Elbow extension    Wrist flexion    Wrist extension    Wrist ulnar deviation    Wrist radial deviation    Wrist pronation    Wrist supination    Grip strength     (Blank rows = not tested)    TODAY'S TREATMENT:                                                                                                                               [facility] Adult PT Treatment:  DATE: 05/05/2023  Therapeutic Exercise: Standing Cervical Retraction, 3 sec x 5  Seated Gentle Upper Trapezius Stretch, 3 x 30 sec  Standing Shoulder Row with Anchored Resistance, red band, x 5   Standing Shoulder Horizontal Abduction with Resistance, red band x 5  Standing Shoulder Diagonal Horizontal Abduction, red band x 5   PATIENT EDUCATION:  Education details: regarding prognosis/rehab potential, identified deficits and related treatment plan Person educated: Patient Education method: Explanation, Demonstration, and Handouts Education comprehension: verbalized understanding, returned demonstration, and needs further education  HOME EXERCISE PROGRAM: Access Code: [email or web address] URL: [email or web address] Date: 05/05/2023 Prepared by: Mauri Reading  Exercises - Standing Cervical Retraction  - 1 x daily - 7 x weekly - 2 sets - 10 reps  - 3 sec hold - Seated Gentle Upper Trapezius Stretch  - 1 x daily - 7 x weekly - 3 sets - 30 sec hold - Standing Shoulder Row with Anchored Resistance  - 1 x daily - 7 x weekly - 2 sets - 10 reps - Standing Shoulder Horizontal Abduction with Resistance  - 1 x daily - 7 x weekly - 2 sets - 10 reps - Standing Shoulder Diagonal Horizontal Abduction 60/120 Degrees with Resistance  - 1 x daily - 7 x weekly - 2 sets - 10 reps  ASSESSMENT:  CLINICAL IMPRESSION: Patient is a 57 y.o. female who was seen today for physical therapy evaluation and treatment for cervical and thoracic pain related to mammary hypertrophy. Patient will benefit from skilled PT to services to address the identified deficits and maximize participation in normal functional activities.   OBJECTIVE IMPAIRMENTS: decreased activity tolerance, decreased endurance, decreased ROM, decreased strength, impaired UE functional use, postural dysfunction, obesity, and pain.   ACTIVITY LIMITATIONS: carrying, lifting, sleeping, and reach over head  PARTICIPATION LIMITATIONS: meal prep, cleaning, community activity, and occupation  PERSONAL FACTORS: Time since onset of injury/illness/exacerbation and 1-2 comorbidities: PMHx includes obesity, symptomatic mammary hypertrophy, and HTN  are also affecting patient's functional outcome.   REHAB POTENTIAL: Fair    CLINICAL DECISION MAKING: Stable/uncomplicated  EVALUATION COMPLEXITY: Low   GOALS: Goals reviewed with patient? Yes  SHORT TERM GOALS: Target date: 05/26/2023   Patient will be independent with initial home program for cervical/thoracic mobility and UQ strengthening.  Baseline: provided at eval  Goal status: INITIAL    LONG TERM GOALS: Target date: 06/16/2023   Patient will report improved overall functional ability with FOTO score of 61 or greater Baseline: 51 Goal status: INITIAL  2.  Patient will demonstrate improved UQ strength to 4+/5 MMT bilaterally.  Baseline: see  objective findings  Goal status: INITIAL  3.  Patient will demonstrate 75% or greater CS AROM in all directions with <3/10 pain at end range.  Baseline: see objective findings.  Goal status: INITIAL  4.  Patient will be independent with HEP for ongoing strengthening and pain management as able.  Baseline: to be progressed throughout POC Goal status: INITIAL    PLAN:  PT FREQUENCY: 1-2x/week  PT DURATION: 6 weeks  PLANNED INTERVENTIONS: Therapeutic exercises, Therapeutic activity, Neuromuscular re-education, Patient/Family education, Self Care, Joint mobilization, Dry Needling, Electrical stimulation, Spinal mobilization, Cryotherapy, Moist heat, Taping, Manual therapy, and Re-evaluation  PLAN FOR NEXT SESSION: cervical/thoracic mobility, periscap & UE strengthening   Mauri Reading, PT, DPT 05/05/2023, 6:52 PM

## 2023-05-10 ENCOUNTER — Ambulatory Visit
Admission: RE | Admit: 2023-05-10 | Discharge: 2023-05-10 | Disposition: A | Discharge status: Home / Self Care | Payer: BC Managed Care – PPO | Source: Ambulatory Visit | Attending: General Surgery | Admitting: General Surgery

## 2023-05-10 DIAGNOSIS — Z1231 Encounter for screening mammogram for malignant neoplasm of breast: Secondary | ICD-10-CM

## 2023-05-12 ENCOUNTER — Ambulatory Visit: Payer: BC Managed Care – PPO | Attending: Plastic Surgery

## 2023-05-12 DIAGNOSIS — M542 Cervicalgia: Secondary | ICD-10-CM | POA: Diagnosis present

## 2023-05-12 DIAGNOSIS — M6281 Muscle weakness (generalized): Secondary | ICD-10-CM

## 2023-05-12 DIAGNOSIS — M546 Pain in thoracic spine: Secondary | ICD-10-CM

## 2023-05-12 NOTE — Therapy (Signed)
OUTPATIENT PHYSICAL THERAPY TREATMENT NOTE   Patient Name: Vicki French MRN: [id number] DOB:[birth year], 57 y.o., female Today's Date: 05/12/2023  END OF SESSION:  PT End of Session - 05/12/23 1527     Visit Number 2    Number of Visits 7    Date for PT Re-Evaluation 06/16/23    Authorization Type BCBS    PT Start Time 1530    PT Stop Time 1608    PT Time Calculation (min) 38 min    Activity Tolerance Patient tolerated treatment well    Behavior During Therapy WFL for tasks assessed/performed              Past Medical History:  Diagnosis Date   Allergy    Arthritis    left ankle   Atrial flutter (HCC)    ruled out by Dr Mariah Milling   Fibroid tumor 2007   removed from uterus    Hypertension    Nipple discharge in female    left   Prediabetes    Past Surgical History:  Procedure Laterality Date   BREAST BIOPSY Left 09/27/2015   U/S Core   BREAST BIOPSY Left 2018   x2   BREAST DUCTAL SYSTEM EXCISION Left 07/23/2017   Procedure: LEFT BREAST SUBAREOLAR DUCTAL EXCISION;  Surgeon: Griselda Miner, MD;  Location: [facility];  Service: General;  Laterality: Left;   BREAST EXCISIONAL BIOPSY Left    UTERINE FIBROID SURGERY     Patient Active Problem List   Diagnosis Date Noted   Back pain 02/05/2023   Neck pain 02/05/2023   Prediabetes 10/17/2018   Breast mass in female 11/15/2015   Nipple discharge in female 11/15/2015   Symptomatic mammary hypertrophy 11/15/2015   Abnormal EKG 02/02/2014   Obesity 02/02/2014   PCP: Peggye Form, DO  REFERRING PROVIDER: Peggye Form, DO  REFERRING DIAG: Hypertrophy of breast [N62], Symptomatic mammary hypertrophy [N62], Chronic bilateral thoracic back pain [M54.6, G89.29], Neck pain [M54.2]   THERAPY DIAG:  Cervicalgia  Muscle weakness (generalized)  Pain in thoracic spine  Rationale for Evaluation and Treatment: Rehabilitation  ONSET DATE: 2 years ago   SUBJECTIVE:                                                                                                                                                                                                          SUBJECTIVE STATEMENT:  Patient has been compliant with HEP. She denies any worsening of symptoms.      PERTINENT HISTORY:  PMHx includes obesity, symptomatic  mammary hypertrophy, and HTN  PAIN:  Are you having pain? Yes: NPRS scale: 5/10 Pain location: shoulders Pain description: aching Aggravating factors: work, constant pain  Relieving factors: removing bra at end of day, tylenol    PRECAUTIONS: None  WEIGHT BEARING RESTRICTIONS: No  FALLS:  Has patient fallen in last 6 months? No  LIVING ENVIRONMENT: Lives with: lives alone Lives in: House/apartment Stairs: No Has following equipment at home: None  OCCUPATION: Writer  PLOF: Independent  PATIENT GOALS: Patient would like to get surgery after finishes PT.   NEXT MD VISIT: to be scheduled following PT POC  OBJECTIVE:    PATIENT SURVEYS:  FOTO 51 current, 67 predicted   COGNITION: Overall cognitive status: Within functional limits for tasks assessed   POSTURE: rounded shoulders and forward head  PALPATION: Moderate tenderness to palpation along bilateral neck and shoulder    CERVICAL ROM:   Active ROM A/PROM (deg) eval  Flexion 100%  Extension 50%  Right lateral flexion 50%  Left lateral flexion 50%  Right rotation 75%  Left rotation 75%  Thoracic flexion  25%  Thoracic extension 25%   (Blank rows = not tested)  UPPER EXTREMITY ROM:  Active ROM Right eval Left eval  Shoulder flexion 170 170  Shoulder extension    Shoulder abduction [facility]  Shoulder adduction    Shoulder extension    Shoulder internal rotation    Shoulder external rotation    Elbow flexion    Elbow extension    Wrist flexion    Wrist extension    Wrist ulnar deviation    Wrist radial deviation    Wrist pronation    Wrist supination      (Blank rows = not tested)  UPPER EXTREMITY MMT:  MMT Right eval Left eval  Shoulder flexion 4- 3+  Shoulder extension    Shoulder abduction 4- 4-  Shoulder adduction    Shoulder extension    Shoulder internal rotation 4 4  Shoulder external rotation 4 4  Middle trapezius 3+ 3+  Lower trapezius 3+ 3+  Elbow flexion    Elbow extension    Wrist flexion    Wrist extension    Wrist ulnar deviation    Wrist radial deviation    Wrist pronation    Wrist supination    Grip strength     (Blank rows = not tested)    TODAY'S TREATMENT:          [facility] Adult PT Treatment:                                                DATE: 05/12/2023  Therapeutic Exercise: UBE level 1, 3'/3' fwd/back  Shoulder Rows, green TB, 2 x 10  Pull downs/shoulder ext, red TB, 2 x 10  Wall Angels with scap retraction, 3 x 5 Shoulder Rolls fwd/back x 10 each  Ball pass/horizontal abduction " 1000g ball, 2 x 5 passes   Standing Shoulder Diagonal Horizontal Abduction, red band, 2 x 5 BIL Doorway pec stretch, 3 x 30 sec  Seated Gentle Upper Trapezius Stretch, 3 x 30 sec  Seated Cervical Retraction, 3 sec x 20  [facility] Adult PT Treatment:                                                DATE: 05/05/2023  Therapeutic Exercise: Standing Cervical Retraction, 3 sec x 5  Seated Gentle Upper Trapezius Stretch, 3 x 30 sec  Standing Shoulder Row with Anchored Resistance, red band, x 5   Standing Shoulder Horizontal Abduction with Resistance, red band x 5  Standing Shoulder Diagonal Horizontal Abduction, red band x 5   PATIENT EDUCATION:  Education details: regarding prognosis/rehab potential, identified deficits and related treatment plan Person educated: Patient Education method: Explanation, Demonstration, and Handouts Education comprehension: verbalized understanding, returned demonstration, and  needs further education  HOME EXERCISE PROGRAM: Access Code: [email or web address] URL: [email or web address] Date: 05/05/2023 Prepared by: Mauri Reading  Exercises - Standing Cervical Retraction  - 1 x daily - 7 x weekly - 2 sets - 10 reps - 3 sec hold - Seated Gentle Upper Trapezius Stretch  - 1 x daily - 7 x weekly - 3 sets - 30 sec hold - Standing Shoulder Row with Anchored Resistance  - 1 x daily - 7 x weekly - 2 sets - 10 reps - Standing Shoulder Horizontal Abduction with Resistance  - 1 x daily - 7 x weekly - 2 sets - 10 reps - Standing Shoulder Diagonal Horizontal Abduction 60/120 Degrees with Resistance  - 1 x daily - 7 x weekly - 2 sets - 10 reps  ASSESSMENT:  CLINICAL IMPRESSION:  Ednah was able to perform initial exercises without exacerbation of symptoms. Encouraged ongoing compliance with home program. We will continue to progress periscapular strengthening and cervical mobility activities at next visit.    OBJECTIVE IMPAIRMENTS: decreased activity tolerance, decreased endurance, decreased ROM, decreased strength, impaired UE functional use, postural dysfunction, obesity, and pain.   ACTIVITY LIMITATIONS: carrying, lifting, sleeping, and reach over head  PARTICIPATION LIMITATIONS: meal prep, cleaning, community activity, and occupation  PERSONAL FACTORS: Time since onset of injury/illness/exacerbation and 1-2 comorbidities: PMHx includes obesity, symptomatic mammary hypertrophy, and HTN  are also affecting patient's functional outcome.   REHAB POTENTIAL: Fair    CLINICAL DECISION MAKING: Stable/uncomplicated  EVALUATION COMPLEXITY: Low   GOALS: Goals reviewed with patient? Yes  SHORT TERM GOALS: Target date: 05/26/2023   Patient will be independent with initial home program for cervical/thoracic mobility and UQ strengthening.  Baseline: provided at eval  Goal status: INITIAL    LONG TERM GOALS: Target date: 06/16/2023   Patient will report improved  overall functional ability with FOTO score of 61 or greater Baseline: 51 Goal status: INITIAL  2.  Patient will demonstrate improved UQ strength to 4+/5 MMT bilaterally.  Baseline: see objective findings  Goal status: INITIAL  3.  Patient will demonstrate 75% or greater CS AROM in all directions with <3/10 pain at end range.  Baseline: see objective findings.  Goal status: INITIAL  4.  Patient will be independent with HEP for ongoing strengthening and pain management as able.  Baseline: to be progressed throughout POC Goal status: INITIAL    PLAN:  PT FREQUENCY: 1-2x/week  PT DURATION: 6 weeks  PLANNED INTERVENTIONS: Therapeutic exercises, Therapeutic activity, Neuromuscular re-education, Patient/Family education, Self Care, Joint mobilization, Dry Needling, Electrical stimulation, Spinal mobilization, Cryotherapy, Moist heat, Taping, Manual therapy, and Re-evaluation  PLAN FOR NEXT SESSION: cervical/thoracic mobility, periscap & UE strengthening  Mauri Reading, PT, DPT 05/12/2023, 4:08 PM

## 2023-05-19 ENCOUNTER — Ambulatory Visit: Payer: BC Managed Care – PPO

## 2023-05-19 DIAGNOSIS — M542 Cervicalgia: Secondary | ICD-10-CM

## 2023-05-19 DIAGNOSIS — M546 Pain in thoracic spine: Secondary | ICD-10-CM

## 2023-05-19 DIAGNOSIS — M6281 Muscle weakness (generalized): Secondary | ICD-10-CM

## 2023-05-19 NOTE — Therapy (Signed)
OUTPATIENT PHYSICAL THERAPY TREATMENT NOTE   Patient Name: Vicki French MRN: [id number] DOB:01/29/1966, 57 y.o., female Today's Date: 05/19/2023  END OF SESSION:  PT End of Session - 05/19/23 1530     Visit Number 3    Number of Visits 7    Date for PT Re-Evaluation 06/16/23    Authorization Type BCBS    PT Start Time 1530    PT Stop Time 1611    PT Time Calculation (min) 41 min    Activity Tolerance Patient tolerated treatment well    Behavior During Therapy WFL for tasks assessed/performed               Past Medical History:  Diagnosis Date   Allergy    Arthritis    left ankle   Atrial flutter (HCC)    ruled out by Dr Mariah Milling   Fibroid tumor 2007   removed from uterus    Hypertension    Nipple discharge in female    left   Prediabetes    Past Surgical History:  Procedure Laterality Date   BREAST BIOPSY Left 09/27/2015   U/S Core   BREAST BIOPSY Left 2018   x2   BREAST DUCTAL SYSTEM EXCISION Left 07/23/2017   Procedure: LEFT BREAST SUBAREOLAR DUCTAL EXCISION;  Surgeon: Griselda Miner, MD;  Location: [facility];  Service: General;  Laterality: Left;   BREAST EXCISIONAL BIOPSY Left    UTERINE FIBROID SURGERY     Patient Active Problem List   Diagnosis Date Noted   Back pain 02/05/2023   Neck pain 02/05/2023   Prediabetes 10/17/2018   Breast mass in female 11/15/2015   Nipple discharge in female 11/15/2015   Symptomatic mammary hypertrophy 11/15/2015   Abnormal EKG 02/02/2014   Obesity 02/02/2014   PCP: Peggye Form, DO  REFERRING PROVIDER: Peggye Form, DO  REFERRING DIAG: Hypertrophy of breast [N62], Symptomatic mammary hypertrophy [N62], Chronic bilateral thoracic back pain [M54.6, G89.29], Neck pain [M54.2]   THERAPY DIAG:  Cervicalgia  Muscle weakness (generalized)  Pain in thoracic spine  Rationale for Evaluation and Treatment: Rehabilitation  ONSET DATE: 2 years ago   SUBJECTIVE:                                                                                                                                                                                                          SUBJECTIVE STATEMENT:  Patient reporting 6/10 pain today.     PERTINENT HISTORY:  PMHx includes obesity, symptomatic mammary hypertrophy, and HTN  PAIN:  Are you having pain? Yes: NPRS scale: 5/10 Pain location: shoulders Pain description: aching Aggravating factors: work, constant pain  Relieving factors: removing bra at end of day, tylenol    PRECAUTIONS: None  WEIGHT BEARING RESTRICTIONS: No  FALLS:  Has patient fallen in last 6 months? No  LIVING ENVIRONMENT: Lives with: lives alone Lives in: House/apartment Stairs: No Has following equipment at home: None  OCCUPATION: Writer  PLOF: Independent  PATIENT GOALS: Patient would like to get surgery after finishes PT.   NEXT MD VISIT: to be scheduled following PT POC  OBJECTIVE:    PATIENT SURVEYS:  FOTO 51 current, 47 predicted   COGNITION: Overall cognitive status: Within functional limits for tasks assessed   POSTURE: rounded shoulders and forward head  PALPATION: Moderate tenderness to palpation along bilateral neck and shoulder    CERVICAL ROM:   Active ROM A/PROM (deg) eval  Flexion 100%  Extension 50%  Right lateral flexion 50%  Left lateral flexion 50%  Right rotation 75%  Left rotation 75%  Thoracic flexion  25%  Thoracic extension 25%   (Blank rows = not tested)  UPPER EXTREMITY ROM:  Active ROM Right eval Left eval  Shoulder flexion 170 170  Shoulder extension    Shoulder abduction [facility]  Shoulder adduction    Shoulder extension    Shoulder internal rotation    Shoulder external rotation    Elbow flexion    Elbow extension    Wrist flexion    Wrist extension    Wrist ulnar deviation    Wrist radial deviation    Wrist pronation    Wrist supination     (Blank rows = not tested)  UPPER  EXTREMITY MMT:  MMT Right eval Left eval  Shoulder flexion 4- 3+  Shoulder extension    Shoulder abduction 4- 4-  Shoulder adduction    Shoulder extension    Shoulder internal rotation 4 4  Shoulder external rotation 4 4  Middle trapezius 3+ 3+  Lower trapezius 3+ 3+  Elbow flexion    Elbow extension    Wrist flexion    Wrist extension    Wrist ulnar deviation    Wrist radial deviation    Wrist pronation    Wrist supination    Grip strength     (Blank rows = not tested)    TODAY'S TREATMENT:          [facility] Adult PT Treatment:                                                DATE: 05/19/2023  Therapeutic Exercise:  UBE level 2, 3'/3' fwd/back  Shoulder Rows, green TB, 2 x 10  Pull downs/shoulder ext, red TB, 2 x 10  Ball pass/horizontal abduction " 1000g ball, 2 x 5 passes   Standing Shoulder Diagonal Horizontal Abduction, green band, 2 x 5 BIL Shoulder Rolls fwd/back x 10 each  Seated Gentle Upper Trapezius Stretch, 2 x 30 sec  Seated Gentle Levator Stretch, 2 x 30 sec  Wall Angels with scap retraction, 15 Seated Cervical Retraction, 3 sec x 20 Doorway pec stretch, 3 x 30 sec    [facility] Adult PT Treatment:  DATE: 05/12/2023  Therapeutic Exercise: UBE level 1, 3'/3' fwd/back  Shoulder Rows, green TB, 2 x 10  Pull downs/shoulder ext, red TB, 2 x 10  Wall Angels with scap retraction, 3 x 5 Shoulder Rolls fwd/back x 10 each  Ball pass/horizontal abduction " 1000g ball, 2 x 5 passes   Standing Shoulder Diagonal Horizontal Abduction, red band, 2 x 5 BIL Doorway pec stretch, 3 x 30 sec  Seated Gentle Upper Trapezius Stretch, 3 x 30 sec  Seated Cervical Retraction, 3 sec x 20                                                                                                                      [facility] Adult PT Treatment:                                                DATE: 05/05/2023  Therapeutic Exercise: Standing Cervical  Retraction, 3 sec x 5  Seated Gentle Upper Trapezius Stretch, 3 x 30 sec  Standing Shoulder Row with Anchored Resistance, red band, x 5   Standing Shoulder Horizontal Abduction with Resistance, red band x 5  Standing Shoulder Diagonal Horizontal Abduction, red band x 5   PATIENT EDUCATION:  Education details: regarding prognosis/rehab potential, identified deficits and related treatment plan Person educated: Patient Education method: Explanation, Demonstration, and Handouts Education comprehension: verbalized understanding, returned demonstration, and needs further education  HOME EXERCISE PROGRAM: Access Code: [email or web address] URL: [email or web address] Date: 05/05/2023 Prepared by: Mauri Reading  Exercises - Standing Cervical Retraction  - 1 x daily - 7 x weekly - 2 sets - 10 reps - 3 sec hold - Seated Gentle Upper Trapezius Stretch  - 1 x daily - 7 x weekly - 3 sets - 30 sec hold - Standing Shoulder Row with Anchored Resistance  - 1 x daily - 7 x weekly - 2 sets - 10 reps - Standing Shoulder Horizontal Abduction with Resistance  - 1 x daily - 7 x weekly - 2 sets - 10 reps - Standing Shoulder Diagonal Horizontal Abduction 60/120 Degrees with Resistance  - 1 x daily - 7 x weekly - 2 sets - 10 reps  ASSESSMENT:  CLINICAL IMPRESSION:  Ayshia continues to progress through her exercise program well without exacerbation of symptoms. She tolerated some progressions of resistance with UBE and periscapular strengthening activities. We will continue to progress exercises as appropriate.    OBJECTIVE IMPAIRMENTS: decreased activity tolerance, decreased endurance, decreased ROM, decreased strength, impaired UE functional use, postural dysfunction, obesity, and pain.   ACTIVITY LIMITATIONS: carrying, lifting, sleeping, and reach over head  PARTICIPATION LIMITATIONS: meal prep, cleaning, community activity, and occupation  PERSONAL FACTORS: Time since onset of  injury/illness/exacerbation and 1-2 comorbidities: PMHx includes obesity, symptomatic mammary hypertrophy, and HTN  are also affecting patient's functional outcome.   REHAB POTENTIAL: Fair    CLINICAL  DECISION MAKING: Stable/uncomplicated  EVALUATION COMPLEXITY: Low   GOALS: Goals reviewed with patient? Yes  SHORT TERM GOALS: Target date: 05/26/2023   Patient will be independent with initial home program for cervical/thoracic mobility and UQ strengthening.  Baseline: provided at eval  Goal status: INITIAL    LONG TERM GOALS: Target date: 06/16/2023   Patient will report improved overall functional ability with FOTO score of 61 or greater Baseline: 51 Goal status: INITIAL  2.  Patient will demonstrate improved UQ strength to 4+/5 MMT bilaterally.  Baseline: see objective findings  Goal status: INITIAL  3.  Patient will demonstrate 75% or greater CS AROM in all directions with <3/10 pain at end range.  Baseline: see objective findings.  Goal status: INITIAL  4.  Patient will be independent with HEP for ongoing strengthening and pain management as able.  Baseline: to be progressed throughout POC Goal status: INITIAL    PLAN:  PT FREQUENCY: 1-2x/week  PT DURATION: 6 weeks  PLANNED INTERVENTIONS: Therapeutic exercises, Therapeutic activity, Neuromuscular re-education, Patient/Family education, Self Care, Joint mobilization, Dry Needling, Electrical stimulation, Spinal mobilization, Cryotherapy, Moist heat, Taping, Manual therapy, and Re-evaluation  PLAN FOR NEXT SESSION: cervical/thoracic mobility, periscap & UE strengthening   Mauri Reading, PT, DPT 05/19/2023, 4:13 PM

## 2023-05-26 ENCOUNTER — Ambulatory Visit: Payer: BC Managed Care – PPO

## 2023-05-29 ENCOUNTER — Ambulatory Visit: Payer: BC Managed Care – PPO

## 2023-05-29 DIAGNOSIS — M6281 Muscle weakness (generalized): Secondary | ICD-10-CM

## 2023-05-29 DIAGNOSIS — M542 Cervicalgia: Secondary | ICD-10-CM

## 2023-05-29 DIAGNOSIS — M546 Pain in thoracic spine: Secondary | ICD-10-CM

## 2023-05-29 NOTE — Therapy (Signed)
OUTPATIENT PHYSICAL THERAPY TREATMENT NOTE   Patient Name: Vicki French MRN: [id number] DOB:28-Dec-1965, 57 y.o., female Today's Date: 05/29/2023  END OF SESSION:  PT End of Session - 05/29/23 0941     Visit Number 4    Number of Visits 7    Date for PT Re-Evaluation 06/16/23    Authorization Type BCBS    PT Start Time 0945    PT Stop Time 1025    PT Time Calculation (min) 40 min    Activity Tolerance Patient tolerated treatment well    Behavior During Therapy WFL for tasks assessed/performed              Past Medical History:  Diagnosis Date   Allergy    Arthritis    left ankle   Atrial flutter (HCC)    ruled out by Dr Mariah Milling   Fibroid tumor 2007   removed from uterus    Hypertension    Nipple discharge in female    left   Prediabetes    Past Surgical History:  Procedure Laterality Date   BREAST BIOPSY Left 09/27/2015   U/S Core   BREAST BIOPSY Left 2018   x2   BREAST DUCTAL SYSTEM EXCISION Left 07/23/2017   Procedure: LEFT BREAST SUBAREOLAR DUCTAL EXCISION;  Surgeon: Griselda Miner, MD;  Location: [facility];  Service: General;  Laterality: Left;   BREAST EXCISIONAL BIOPSY Left    UTERINE FIBROID SURGERY     Patient Active Problem List   Diagnosis Date Noted   Back pain 02/05/2023   Neck pain 02/05/2023   Prediabetes 10/17/2018   Breast mass in female 11/15/2015   Nipple discharge in female 11/15/2015   Symptomatic mammary hypertrophy 11/15/2015   Abnormal EKG 02/02/2014   Obesity 02/02/2014   PCP: Peggye Form, DO  REFERRING PROVIDER: Peggye Form, DO  REFERRING DIAG: Hypertrophy of breast [N62], Symptomatic mammary hypertrophy [N62], Chronic bilateral thoracic back pain [M54.6, G89.29], Neck pain [M54.2]   THERAPY DIAG:  Cervicalgia  Muscle weakness (generalized)  Pain in thoracic spine  Rationale for Evaluation and Treatment: Rehabilitation  ONSET DATE: 2 years ago   SUBJECTIVE:                                                                                                                                                                                                          SUBJECTIVE STATEMENT:  Patient reports that her neck and upper back pain continue, particularly on the Lt side.     PERTINENT HISTORY:  PMHx includes  obesity, symptomatic mammary hypertrophy, and HTN  PAIN:  Are you having pain? Yes: NPRS scale: 5/10 Pain location: shoulders Pain description: aching Aggravating factors: work, constant pain  Relieving factors: removing bra at end of day, tylenol    PRECAUTIONS: None  WEIGHT BEARING RESTRICTIONS: No  FALLS:  Has patient fallen in last 6 months? No  LIVING ENVIRONMENT: Lives with: lives alone Lives in: House/apartment Stairs: No Has following equipment at home: None  OCCUPATION: Writer  PLOF: Independent  PATIENT GOALS: Patient would like to get surgery after finishes PT.   NEXT MD VISIT: to be scheduled following PT POC  OBJECTIVE:    PATIENT SURVEYS:  FOTO 51 current, 42 predicted   COGNITION: Overall cognitive status: Within functional limits for tasks assessed   POSTURE: rounded shoulders and forward head  PALPATION: Moderate tenderness to palpation along bilateral neck and shoulder    CERVICAL ROM:   Active ROM A/PROM (deg) eval  Flexion 100%  Extension 50%  Right lateral flexion 50%  Left lateral flexion 50%  Right rotation 75%  Left rotation 75%  Thoracic flexion  25%  Thoracic extension 25%   (Blank rows = not tested)  UPPER EXTREMITY ROM:  Active ROM Right eval Left eval  Shoulder flexion 170 170  Shoulder extension    Shoulder abduction [facility]  Shoulder adduction    Shoulder extension    Shoulder internal rotation    Shoulder external rotation    Elbow flexion    Elbow extension    Wrist flexion    Wrist extension    Wrist ulnar deviation    Wrist radial deviation    Wrist pronation    Wrist  supination     (Blank rows = not tested)  UPPER EXTREMITY MMT:  MMT Right eval Left eval  Shoulder flexion 4- 3+  Shoulder extension    Shoulder abduction 4- 4-  Shoulder adduction    Shoulder extension    Shoulder internal rotation 4 4  Shoulder external rotation 4 4  Middle trapezius 3+ 3+  Lower trapezius 3+ 3+  Elbow flexion    Elbow extension    Wrist flexion    Wrist extension    Wrist ulnar deviation    Wrist radial deviation    Wrist pronation    Wrist supination    Grip strength     (Blank rows = not tested)    TODAY'S TREATMENT:         [facility] Adult PT Treatment:                                                DATE: 05/29/23 Therapeutic Exercise: UBE level 2, 3'/3' fwd/back  Shoulder Rows, green TB, 2 x 10  Pull downs/shoulder ext, red TB, 2 x 10   Seated Shoulder Diagonal Horizontal Abduction, green band, 2 x 5 BIL Shoulder Rolls fwd/back x 10 each  Seated Gentle Upper Trapezius Stretch, 2 x 30 sec  Wall Angels with scap retraction, 15 Doorway pec stretch, 3 x 30 sec  Seated BIL ER with scap retraction RTB 2x10 Palloff press 7# 2x10 BIL   [facility] Adult PT Treatment:  DATE: 05/19/2023  Therapeutic Exercise:  UBE level 2, 3'/3' fwd/back  Shoulder Rows, green TB, 2 x 10  Pull downs/shoulder ext, red TB, 2 x 10  Ball pass/horizontal abduction " 1000g ball, 2 x 5 passes   Standing Shoulder Diagonal Horizontal Abduction, green band, 2 x 5 BIL Shoulder Rolls fwd/back x 10 each  Seated Gentle Upper Trapezius Stretch, 2 x 30 sec  Seated Gentle Levator Stretch, 2 x 30 sec  Wall Angels with scap retraction, 15 Seated Cervical Retraction, 3 sec x 20 Doorway pec stretch, 3 x 30 sec    [facility] Adult PT Treatment:                                                DATE: 05/12/2023  Therapeutic Exercise: UBE level 1, 3'/3' fwd/back  Shoulder Rows, green TB, 2 x 10  Pull downs/shoulder ext, red TB, 2 x 10  Wall Angels with scap  retraction, 3 x 5 Shoulder Rolls fwd/back x 10 each  Ball pass/horizontal abduction " 1000g ball, 2 x 5 passes   Standing Shoulder Diagonal Horizontal Abduction, red band, 2 x 5 BIL Doorway pec stretch, 3 x 30 sec  Seated Gentle Upper Trapezius Stretch, 3 x 30 sec  Seated Cervical Retraction, 3 sec x 20      PATIENT EDUCATION:  Education details: regarding prognosis/rehab potential, identified deficits and related treatment plan Person educated: Patient Education method: Explanation, Demonstration, and Handouts Education comprehension: verbalized understanding, returned demonstration, and needs further education  HOME EXERCISE PROGRAM: Access Code: [email or web address] URL: [email or web address] Date: 05/05/2023 Prepared by: Mauri Reading  Exercises - Standing Cervical Retraction  - 1 x daily - 7 x weekly - 2 sets - 10 reps - 3 sec hold - Seated Gentle Upper Trapezius Stretch  - 1 x daily - 7 x weekly - 3 sets - 30 sec hold - Standing Shoulder Row with Anchored Resistance  - 1 x daily - 7 x weekly - 2 sets - 10 reps - Standing Shoulder Horizontal Abduction with Resistance  - 1 x daily - 7 x weekly - 2 sets - 10 reps - Standing Shoulder Diagonal Horizontal Abduction 60/120 Degrees with Resistance  - 1 x daily - 7 x weekly - 2 sets - 10 reps  ASSESSMENT:  CLINICAL IMPRESSION: Patient presents to PT reporting continued neck and upper back pain. Session today continued to focus on periscapular and core strengthening. Patient was able to tolerate all prescribed exercises with no adverse effects. Patient continues to benefit from skilled PT services and should be progressed as able to improve functional independence.     OBJECTIVE IMPAIRMENTS: decreased activity tolerance, decreased endurance, decreased ROM, decreased strength, impaired UE functional use, postural dysfunction, obesity, and pain.   ACTIVITY LIMITATIONS: carrying, lifting, sleeping, and reach over head  PARTICIPATION  LIMITATIONS: meal prep, cleaning, community activity, and occupation  PERSONAL FACTORS: Time since onset of injury/illness/exacerbation and 1-2 comorbidities: PMHx includes obesity, symptomatic mammary hypertrophy, and HTN  are also affecting patient's functional outcome.   REHAB POTENTIAL: Fair    CLINICAL DECISION MAKING: Stable/uncomplicated  EVALUATION COMPLEXITY: Low   GOALS: Goals reviewed with patient? Yes  SHORT TERM GOALS: Target date: 05/26/2023   Patient will be independent with initial home program for cervical/thoracic mobility and UQ strengthening.  Baseline: provided at eval  Goal status: INITIAL  LONG TERM GOALS: Target date: 06/16/2023   Patient will report improved overall functional ability with FOTO score of 61 or greater Baseline: 51 Goal status: INITIAL  2.  Patient will demonstrate improved UQ strength to 4+/5 MMT bilaterally.  Baseline: see objective findings  Goal status: INITIAL  3.  Patient will demonstrate 75% or greater CS AROM in all directions with <3/10 pain at end range.  Baseline: see objective findings.  Goal status: INITIAL  4.  Patient will be independent with HEP for ongoing strengthening and pain management as able.  Baseline: to be progressed throughout POC Goal status: INITIAL    PLAN:  PT FREQUENCY: 1-2x/week  PT DURATION: 6 weeks  PLANNED INTERVENTIONS: Therapeutic exercises, Therapeutic activity, Neuromuscular re-education, Patient/Family education, Self Care, Joint mobilization, Dry Needling, Electrical stimulation, Spinal mobilization, Cryotherapy, Moist heat, Taping, Manual therapy, and Re-evaluation  PLAN FOR NEXT SESSION: cervical/thoracic mobility, periscap & UE strengthening   Berta Minor, PTA 05/29/2023, 10:24 AM

## 2023-06-02 ENCOUNTER — Ambulatory Visit: Payer: BC Managed Care – PPO

## 2023-06-02 DIAGNOSIS — M6281 Muscle weakness (generalized): Secondary | ICD-10-CM

## 2023-06-02 DIAGNOSIS — M546 Pain in thoracic spine: Secondary | ICD-10-CM

## 2023-06-02 DIAGNOSIS — M542 Cervicalgia: Secondary | ICD-10-CM

## 2023-06-02 NOTE — Therapy (Signed)
OUTPATIENT PHYSICAL THERAPY TREATMENT NOTE   Patient Name: Vicki French MRN: [id number] DOB:1966-08-12, 57 y.o., female Today's Date: 06/02/2023  END OF SESSION:  PT End of Session - 06/02/23 1657     Visit Number 5    Number of Visits 7    Date for PT Re-Evaluation 06/16/23    Authorization Type BCBS    PT Start Time 1700    PT Stop Time 1734    PT Time Calculation (min) 34 min    Activity Tolerance Patient tolerated treatment well    Behavior During Therapy WFL for tasks assessed/performed               Past Medical History:  Diagnosis Date   Allergy    Arthritis    left ankle   Atrial flutter (HCC)    ruled out by Dr Mariah Milling   Fibroid tumor 2007   removed from uterus    Hypertension    Nipple discharge in female    left   Prediabetes    Past Surgical History:  Procedure Laterality Date   BREAST BIOPSY Left 09/27/2015   U/S Core   BREAST BIOPSY Left 2018   x2   BREAST DUCTAL SYSTEM EXCISION Left 07/23/2017   Procedure: LEFT BREAST SUBAREOLAR DUCTAL EXCISION;  Surgeon: Griselda Miner, MD;  Location: [facility];  Service: General;  Laterality: Left;   BREAST EXCISIONAL BIOPSY Left    UTERINE FIBROID SURGERY     Patient Active Problem List   Diagnosis Date Noted   Back pain 02/05/2023   Neck pain 02/05/2023   Prediabetes 10/17/2018   Breast mass in female 11/15/2015   Nipple discharge in female 11/15/2015   Symptomatic mammary hypertrophy 11/15/2015   Abnormal EKG 02/02/2014   Obesity 02/02/2014   PCP: Peggye Form, DO  REFERRING PROVIDER: Peggye Form, DO  REFERRING DIAG: Hypertrophy of breast [N62], Symptomatic mammary hypertrophy [N62], Chronic bilateral thoracic back pain [M54.6, G89.29], Neck pain [M54.2]   THERAPY DIAG:  Cervicalgia  Pain in thoracic spine  Muscle weakness (generalized)  Rationale for Evaluation and Treatment: Rehabilitation  ONSET DATE: 2 years ago   SUBJECTIVE:                                                                                                                                                                                                          SUBJECTIVE STATEMENT:  Patient reporting to PT after long work day and has general fatigue. She has about 4-5/10 pain today.     PERTINENT  HISTORY:  PMHx includes obesity, symptomatic mammary hypertrophy, and HTN  PAIN:  Are you having pain? Yes: NPRS scale: 5/10 Pain location: shoulders Pain description: aching Aggravating factors: work, constant pain  Relieving factors: removing bra at end of day, tylenol    PRECAUTIONS: None  WEIGHT BEARING RESTRICTIONS: No  FALLS:  Has patient fallen in last 6 months? No  LIVING ENVIRONMENT: Lives with: lives alone Lives in: House/apartment Stairs: No Has following equipment at home: None  OCCUPATION: Writer  PLOF: Independent  PATIENT GOALS: Patient would like to get surgery after finishes PT.   NEXT MD VISIT: to be scheduled following PT POC  OBJECTIVE:    PATIENT SURVEYS:  FOTO 51 current, 18 predicted   COGNITION: Overall cognitive status: Within functional limits for tasks assessed   POSTURE: rounded shoulders and forward head  PALPATION: Moderate tenderness to palpation along bilateral neck and shoulder    CERVICAL ROM:   Active ROM A/PROM (deg) eval  Flexion 100%  Extension 50%  Right lateral flexion 50%  Left lateral flexion 50%  Right rotation 75%  Left rotation 75%  Thoracic flexion  25%  Thoracic extension 25%   (Blank rows = not tested)  UPPER EXTREMITY ROM:  Active ROM Right eval Left eval  Shoulder flexion 170 170  Shoulder extension    Shoulder abduction [facility]  Shoulder adduction    Shoulder extension    Shoulder internal rotation    Shoulder external rotation    Elbow flexion    Elbow extension    Wrist flexion    Wrist extension    Wrist ulnar deviation    Wrist radial deviation    Wrist  pronation    Wrist supination     (Blank rows = not tested)  UPPER EXTREMITY MMT:  MMT Right eval Left eval  Shoulder flexion 4- 3+  Shoulder extension    Shoulder abduction 4- 4-  Shoulder adduction    Shoulder extension    Shoulder internal rotation 4 4  Shoulder external rotation 4 4  Middle trapezius 3+ 3+  Lower trapezius 3+ 3+  Elbow flexion    Elbow extension    Wrist flexion    Wrist extension    Wrist ulnar deviation    Wrist radial deviation    Wrist pronation    Wrist supination    Grip strength     (Blank rows = not tested)    TODAY'S TREATMENT:      [facility] Adult PT Treatment:                                                DATE: 06/02/2023  Therapeutic Exercise: UBE level 2, 3'/3' fwd/back  Shoulder Rows, green TB, 2 x 10  Pull downs/shoulder ext, green TB, 2 x 15   Seated Shoulder Diagonal Horizontal Abduction, green band, x 15 BIL Shoulder Rolls fwd/back x 10 each  Seated Cervical Retraction, 3 sec hold x 20  Seated Gentle Upper Trapezius Stretch, 2 x 30 sec BIL Wall Angels with scap retraction x 15 Doorway pec stretch, 3 x 30 sec  Seated BIL ER with scap retraction blue TB 2x10 Seated pball press down, 3 sec x 20    [facility] Adult PT Treatment:  DATE: 05/29/23 Therapeutic Exercise: UBE level 2, 3'/3' fwd/back  Shoulder Rows, green TB, 2 x 10  Pull downs/shoulder ext, red TB, 2 x 10   Seated Shoulder Diagonal Horizontal Abduction, green band, 2 x 5 BIL Shoulder Rolls fwd/back x 10 each  Seated Gentle Upper Trapezius Stretch, 2 x 30 sec  Wall Angels with scap retraction, 15 Doorway pec stretch, 3 x 30 sec  Seated BIL ER with scap retraction RTB 2x10 Palloff press 7# 2x10 BIL   [facility] Adult PT Treatment:                                                DATE: 05/19/2023  Therapeutic Exercise:  UBE level 2, 3'/3' fwd/back  Shoulder Rows, green TB, 2 x 10  Pull downs/shoulder ext, red TB, 2 x 10  Ball  pass/horizontal abduction " 1000g ball, 2 x 5 passes   Standing Shoulder Diagonal Horizontal Abduction, green band, 2 x 5 BIL Shoulder Rolls fwd/back x 10 each  Seated Gentle Upper Trapezius Stretch, 2 x 30 sec  Seated Gentle Levator Stretch, 2 x 30 sec  Wall Angels with scap retraction, 15 Seated Cervical Retraction, 3 sec x 20 Doorway pec stretch, 3 x 30 sec       PATIENT EDUCATION:  Education details: regarding prognosis/rehab potential, identified deficits and related treatment plan Person educated: Patient Education method: Explanation, Demonstration, and Handouts Education comprehension: verbalized understanding, returned demonstration, and needs further education  HOME EXERCISE PROGRAM: Access Code: [email or web address] URL: [email or web address] Date: 05/05/2023 Prepared by: Mauri Reading  Exercises - Standing Cervical Retraction  - 1 x daily - 7 x weekly - 2 sets - 10 reps - 3 sec hold - Seated Gentle Upper Trapezius Stretch  - 1 x daily - 7 x weekly - 3 sets - 30 sec hold - Standing Shoulder Row with Anchored Resistance  - 1 x daily - 7 x weekly - 2 sets - 10 reps - Standing Shoulder Horizontal Abduction with Resistance  - 1 x daily - 7 x weekly - 2 sets - 10 reps - Standing Shoulder Diagonal Horizontal Abduction 60/120 Degrees with Resistance  - 1 x daily - 7 x weekly - 2 sets - 10 reps  ASSESSMENT:  CLINICAL IMPRESSION: Azariya was able to perform exercises with increased resistance today, despite overall fatigue level prior to today's session. She continues to benefit from core and periscapular strengthening program. We will continue with program as tolerated to maximize overall rehab potential.     OBJECTIVE IMPAIRMENTS: decreased activity tolerance, decreased endurance, decreased ROM, decreased strength, impaired UE functional use, postural dysfunction, obesity, and pain.   ACTIVITY LIMITATIONS: carrying, lifting, sleeping, and reach over head  PARTICIPATION  LIMITATIONS: meal prep, cleaning, community activity, and occupation  PERSONAL FACTORS: Time since onset of injury/illness/exacerbation and 1-2 comorbidities: PMHx includes obesity, symptomatic mammary hypertrophy, and HTN  are also affecting patient's functional outcome.   REHAB POTENTIAL: Fair    CLINICAL DECISION MAKING: Stable/uncomplicated  EVALUATION COMPLEXITY: Low   GOALS: Goals reviewed with patient? Yes  SHORT TERM GOALS: Target date: 05/26/2023   Patient will be independent with initial home program for cervical/thoracic mobility and UQ strengthening.  Baseline: provided at eval  Goal status: INITIAL    LONG TERM GOALS: Target date: 06/16/2023   Patient will report improved overall functional ability with FOTO score of  61 or greater Baseline: 51 Goal status: INITIAL  2.  Patient will demonstrate improved UQ strength to 4+/5 MMT bilaterally.  Baseline: see objective findings  Goal status: INITIAL  3.  Patient will demonstrate 75% or greater CS AROM in all directions with <3/10 pain at end range.  Baseline: see objective findings.  Goal status: INITIAL  4.  Patient will be independent with HEP for ongoing strengthening and pain management as able.  Baseline: to be progressed throughout POC Goal status: INITIAL    PLAN:  PT FREQUENCY: 1-2x/week  PT DURATION: 6 weeks  PLANNED INTERVENTIONS: Therapeutic exercises, Therapeutic activity, Neuromuscular re-education, Patient/Family education, Self Care, Joint mobilization, Dry Needling, Electrical stimulation, Spinal mobilization, Cryotherapy, Moist heat, Taping, Manual therapy, and Re-evaluation  PLAN FOR NEXT SESSION: cervical/thoracic mobility, periscap & UE strengthening   Mauri Reading, PT, DPT 06/02/2023, 5:35 PM

## 2023-06-09 ENCOUNTER — Ambulatory Visit: Payer: BC Managed Care – PPO

## 2023-06-09 DIAGNOSIS — M542 Cervicalgia: Secondary | ICD-10-CM | POA: Diagnosis not present

## 2023-06-09 DIAGNOSIS — M6281 Muscle weakness (generalized): Secondary | ICD-10-CM

## 2023-06-09 DIAGNOSIS — M546 Pain in thoracic spine: Secondary | ICD-10-CM

## 2023-06-09 NOTE — Therapy (Signed)
OUTPATIENT PHYSICAL THERAPY TREATMENT NOTE   Patient Name: Vicki French MRN: [id number] DOB:1965-12-01, 57 y.o., female Today's Date: 06/09/2023  END OF SESSION:  PT End of Session - 06/09/23 1652     Visit Number 6    Number of Visits 7    Date for PT Re-Evaluation 06/16/23    Authorization Type BCBS    PT Start Time 1652    PT Stop Time 1730    PT Time Calculation (min) 38 min    Activity Tolerance Patient tolerated treatment well    Behavior During Therapy WFL for tasks assessed/performed                Past Medical History:  Diagnosis Date   Allergy    Arthritis    left ankle   Atrial flutter (HCC)    ruled out by Dr Mariah Milling   Fibroid tumor 2007   removed from uterus    Hypertension    Nipple discharge in female    left   Prediabetes    Past Surgical History:  Procedure Laterality Date   BREAST BIOPSY Left 09/27/2015   U/S Core   BREAST BIOPSY Left 2018   x2   BREAST DUCTAL SYSTEM EXCISION Left 07/23/2017   Procedure: LEFT BREAST SUBAREOLAR DUCTAL EXCISION;  Surgeon: Griselda Miner, MD;  Location: [facility];  Service: General;  Laterality: Left;   BREAST EXCISIONAL BIOPSY Left    UTERINE FIBROID SURGERY     Patient Active Problem List   Diagnosis Date Noted   Back pain 02/05/2023   Neck pain 02/05/2023   Prediabetes 10/17/2018   Breast mass in female 11/15/2015   Nipple discharge in female 11/15/2015   Symptomatic mammary hypertrophy 11/15/2015   Abnormal EKG 02/02/2014   Obesity 02/02/2014   PCP: Vicki Form, DO  REFERRING PROVIDER: Peggye Form, DO  REFERRING DIAG: Hypertrophy of breast [N62], Symptomatic mammary hypertrophy [N62], Chronic bilateral thoracic back pain [M54.6, G89.29], Neck pain [M54.2]   THERAPY DIAG:  Cervicalgia  Pain in thoracic spine  Muscle weakness (generalized)  Rationale for Evaluation and Treatment: Rehabilitation  ONSET DATE: 2 years ago   SUBJECTIVE:                                                                                                                                                                                                          SUBJECTIVE STATEMENT: Patient reporting minimal pain in upper back at start of today's session.     PERTINENT HISTORY:  PMHx includes obesity, symptomatic  mammary hypertrophy, and HTN  PAIN:  Are you having pain? Yes: NPRS scale: 5/10 Pain location: shoulders Pain description: aching Aggravating factors: work, constant pain  Relieving factors: removing bra at end of day, tylenol    PRECAUTIONS: None  WEIGHT BEARING RESTRICTIONS: No  FALLS:  Has patient fallen in last 6 months? No  LIVING ENVIRONMENT: Lives with: lives alone Lives in: House/apartment Stairs: No Has following equipment at home: None  OCCUPATION: Writer  PLOF: Independent  PATIENT GOALS: Patient would like to get surgery after finishes PT.   NEXT MD VISIT: to be scheduled following PT POC  OBJECTIVE:    PATIENT SURVEYS:  FOTO 51 current, 25 predicted   COGNITION: Overall cognitive status: Within functional limits for tasks assessed   POSTURE: rounded shoulders and forward head  PALPATION: Moderate tenderness to palpation along bilateral neck and shoulder    CERVICAL ROM:   Active ROM A/PROM (deg) eval  Flexion 100%  Extension 50%  Right lateral flexion 50%  Left lateral flexion 50%  Right rotation 75%  Left rotation 75%  Thoracic flexion  25%  Thoracic extension 25%   (Blank rows = not tested)  UPPER EXTREMITY ROM:  Active ROM Right eval Left eval  Shoulder flexion 170 170  Shoulder extension    Shoulder abduction [facility]  Shoulder adduction    Shoulder extension    Shoulder internal rotation    Shoulder external rotation    Elbow flexion    Elbow extension    Wrist flexion    Wrist extension    Wrist ulnar deviation    Wrist radial deviation    Wrist pronation    Wrist supination      (Blank rows = not tested)  UPPER EXTREMITY MMT:  MMT Right eval Left eval  Shoulder flexion 4- 3+  Shoulder extension    Shoulder abduction 4- 4-  Shoulder adduction    Shoulder extension    Shoulder internal rotation 4 4  Shoulder external rotation 4 4  Middle trapezius 3+ 3+  Lower trapezius 3+ 3+  Elbow flexion    Elbow extension    Wrist flexion    Wrist extension    Wrist ulnar deviation    Wrist radial deviation    Wrist pronation    Wrist supination    Grip strength     (Blank rows = not tested)    TODAY'S TREATMENT:      [facility] Adult PT Treatment:                                                DATE: 06/09/2023  Therapeutic Exercise: UBE level 2.5, 2'/2' fwd/back  Shoulder Rows, blue TB, 2 x 15  Pull downs/shoulder ext, blue TB, 2 x 15   Seated Shoulder Diagonal Horizontal Abduction, green band, x 15 BIL Seated BIL ER with scap retraction green TB 2x10 Shoulder Rolls fwd/back x 10 each  Seated Cervical Retraction into ball, 3 sec hold x 20  Seated Gentle Levator Scapulae Stretch, 2 x 30 sec BIL Doorway pec stretch, 3 x 30 sec     [facility] Adult PT Treatment:  DATE: 06/02/2023  Therapeutic Exercise: UBE level 2, 3'/3' fwd/back  Shoulder Rows, green TB, 2 x 10  Pull downs/shoulder ext, green TB, 2 x 15   Seated Shoulder Diagonal Horizontal Abduction, green band, x 15 BIL Shoulder Rolls fwd/back x 10 each  Seated Cervical Retraction, 3 sec hold x 20  Seated Gentle Upper Trapezius Stretch, 2 x 30 sec BIL Wall Angels with scap retraction x 15 Doorway pec stretch, 3 x 30 sec  Seated BIL ER with scap retraction blue TB 2x10 Seated pball press down, 3 sec x 20    [facility] Adult PT Treatment:                                                DATE: 05/29/23 Therapeutic Exercise: UBE level 2, 3'/3' fwd/back  Shoulder Rows, green TB, 2 x 10  Pull downs/shoulder ext, red TB, 2 x 10   Seated Shoulder Diagonal Horizontal  Abduction, green band, 2 x 5 BIL Shoulder Rolls fwd/back x 10 each  Seated Gentle Upper Trapezius Stretch, 2 x 30 sec  Wall Angels with scap retraction, 15 Doorway pec stretch, 3 x 30 sec  Seated BIL ER with scap retraction RTB 2x10 Palloff press 7# 2x10 BIL      PATIENT EDUCATION:  Education details: regarding prognosis/rehab potential, identified deficits and related treatment plan Person educated: Patient Education method: Explanation, Demonstration, and Handouts Education comprehension: verbalized understanding, returned demonstration, and needs further education  HOME EXERCISE PROGRAM: Access Code: [email or web address] URL: [email or web address] Date: 05/05/2023 Prepared by: Mauri Reading  Exercises - Standing Cervical Retraction  - 1 x daily - 7 x weekly - 2 sets - 10 reps - 3 sec hold - Seated Gentle Upper Trapezius Stretch  - 1 x daily - 7 x weekly - 3 sets - 30 sec hold - Standing Shoulder Row with Anchored Resistance  - 1 x daily - 7 x weekly - 2 sets - 10 reps - Standing Shoulder Horizontal Abduction with Resistance  - 1 x daily - 7 x weekly - 2 sets - 10 reps - Standing Shoulder Diagonal Horizontal Abduction 60/120 Degrees with Resistance  - 1 x daily - 7 x weekly - 2 sets - 10 reps  ASSESSMENT:  CLINICAL IMPRESSION: Estefanie was able to tolerate increased resistance during today's session, including during UBE at start of session. She continue to be limited by overall muscle fatigue. Plan is to discharge at next visit with updated home exercise program for ongoing maintenance and progress as possible. However, patient is still planning to pursue breast reduction procedure.     OBJECTIVE IMPAIRMENTS: decreased activity tolerance, decreased endurance, decreased ROM, decreased strength, impaired UE functional use, postural dysfunction, obesity, and pain.   ACTIVITY LIMITATIONS: carrying, lifting, sleeping, and reach over head  PARTICIPATION LIMITATIONS: meal prep,  cleaning, community activity, and occupation  PERSONAL FACTORS: Time since onset of injury/illness/exacerbation and 1-2 comorbidities: PMHx includes obesity, symptomatic mammary hypertrophy, and HTN  are also affecting patient's functional outcome.   REHAB POTENTIAL: Fair    CLINICAL DECISION MAKING: Stable/uncomplicated  EVALUATION COMPLEXITY: Low   GOALS: Goals reviewed with patient? Yes  SHORT TERM GOALS: Target date: 05/26/2023   Patient will be independent with initial home program for cervical/thoracic mobility and UQ strengthening.  Baseline: provided at eval  Goal status: INITIAL    LONG TERM GOALS: Target date:  06/16/2023   Patient will report improved overall functional ability with FOTO score of 61 or greater Baseline: 51 Goal status: INITIAL  2.  Patient will demonstrate improved UQ strength to 4+/5 MMT bilaterally.  Baseline: see objective findings  Goal status: INITIAL  3.  Patient will demonstrate 75% or greater CS AROM in all directions with <3/10 pain at end range.  Baseline: see objective findings.  Goal status: INITIAL  4.  Patient will be independent with HEP for ongoing strengthening and pain management as able.  Baseline: to be progressed throughout POC Goal status: INITIAL    PLAN:  PT FREQUENCY: 1-2x/week  PT DURATION: 6 weeks  PLANNED INTERVENTIONS: Therapeutic exercises, Therapeutic activity, Neuromuscular re-education, Patient/Family education, Self Care, Joint mobilization, Dry Needling, Electrical stimulation, Spinal mobilization, Cryotherapy, Moist heat, Taping, Manual therapy, and Re-evaluation  PLAN FOR NEXT SESSION: cervical/thoracic mobility, periscap & UE strengthening   Mauri Reading, PT, DPT 06/09/2023, 5:35 PM

## 2023-06-16 ENCOUNTER — Ambulatory Visit: Payer: BC Managed Care – PPO | Attending: Plastic Surgery

## 2023-06-16 DIAGNOSIS — M542 Cervicalgia: Secondary | ICD-10-CM

## 2023-06-16 DIAGNOSIS — M6281 Muscle weakness (generalized): Secondary | ICD-10-CM

## 2023-06-16 DIAGNOSIS — M546 Pain in thoracic spine: Secondary | ICD-10-CM

## 2023-06-16 NOTE — Therapy (Signed)
OUTPATIENT PHYSICAL THERAPY TREATMENT NOTE and DISCHARGE SUMMARY    Patient Name: Vicki French MRN: [id number] DOB:1966-02-17, 57 y.o., female Today's Date: 06/16/2023   PHYSICAL THERAPY DISCHARGE SUMMARY  Visits from Start of Care: 7  Current functional level related to goals / functional outcomes: See objective findings/assessment    Remaining deficits: See objective findings/assessment    Education / Equipment: See treatment/assessment    Patient agrees to discharge. Patient goals were partially met. Patient is being discharged due to the patient's request. She would like to pursue breast reduction surgery for ongoing symptoms and limitations.    END OF SESSION:  PT End of Session - 06/16/23 1514     Visit Number 7    Number of Visits 7    Date for PT Re-Evaluation 06/16/23    Authorization Type BCBS    PT Start Time 1515    PT Stop Time 1557    PT Time Calculation (min) 42 min    Activity Tolerance Patient tolerated treatment well    Behavior During Therapy WFL for tasks assessed/performed                 Past Medical History:  Diagnosis Date   Allergy    Arthritis    left ankle   Atrial flutter (HCC)    ruled out by Dr Mariah Milling   Fibroid tumor 2007   removed from uterus    Hypertension    Nipple discharge in female    left   Prediabetes    Past Surgical History:  Procedure Laterality Date   BREAST BIOPSY Left 09/27/2015   U/S Core   BREAST BIOPSY Left 2018   x2   BREAST DUCTAL SYSTEM EXCISION Left 07/23/2017   Procedure: LEFT BREAST SUBAREOLAR DUCTAL EXCISION;  Surgeon: Griselda Miner, MD;  Location: [facility];  Service: General;  Laterality: Left;   BREAST EXCISIONAL BIOPSY Left    UTERINE FIBROID SURGERY     Patient Active Problem List   Diagnosis Date Noted   Back pain 02/05/2023   Neck pain 02/05/2023   Prediabetes 10/17/2018   Breast mass in female 11/15/2015   Nipple discharge in female 11/15/2015   Symptomatic  mammary hypertrophy 11/15/2015   Abnormal EKG 02/02/2014   Obesity 02/02/2014    PCP: Peggye Form, DO  REFERRING PROVIDER: Peggye Form, DO  REFERRING DIAG: Hypertrophy of breast [N62], Symptomatic mammary hypertrophy [N62], Chronic bilateral thoracic back pain [M54.6, G89.29], Neck pain [M54.2]   THERAPY DIAG:  Cervicalgia  Pain in thoracic spine  Muscle weakness (generalized)  Rationale for Evaluation and Treatment: Rehabilitation  ONSET DATE: 2 years ago   SUBJECTIVE:  SUBJECTIVE STATEMENT: Patient reporting ongoing pain that is moderate severity. She feels that PT has been somewhat helpful, but is ready to have a breast reduction consult. She is ready for d/c from PT today     PERTINENT HISTORY:  PMHx includes obesity, symptomatic mammary hypertrophy, and HTN  PAIN:  Are you having pain? Yes: NPRS scale: 4-5/10 Pain location: shoulders Pain description: aching Aggravating factors: work, constant pain  Relieving factors: removing bra at end of day, tylenol    PRECAUTIONS: None  WEIGHT BEARING RESTRICTIONS: No  FALLS:  Has patient fallen in last 6 months? No  LIVING ENVIRONMENT: Lives with: lives alone Lives in: House/apartment Stairs: No Has following equipment at home: None  OCCUPATION: Writer  PLOF: Independent  PATIENT GOALS: Patient would like to get surgery after finishes PT.   NEXT MD VISIT: to be scheduled following PT POC  OBJECTIVE:    PATIENT SURVEYS:  FOTO 51 current, 2 predicted   COGNITION: Overall cognitive status: Within functional limits for tasks assessed   POSTURE: rounded shoulders and forward head  PALPATION: Moderate tenderness to palpation along bilateral neck and shoulder    CERVICAL ROM:    Active ROM AROM (deg) eval 06/16/23  Flexion 100% 100%  Extension 50% 60%  Right lateral flexion 50% 70%  Left lateral flexion 50% 50%  Right rotation 75% 80% feels stiff  Left rotation 75% 90% feels stiff  Thoracic flexion  25% 25%  Thoracic extension 25% 25%   (Blank rows = not tested)  UPPER EXTREMITY ROM:  Active ROM Right eval Left eval Right 06/16/23 Left 06/16/23  Shoulder flexion 170 170 WFL WFL  Shoulder extension      Shoulder abduction [facility]    Shoulder adduction      Shoulder extension      Shoulder internal rotation      Shoulder external rotation      Elbow flexion      Elbow extension      Wrist flexion      Wrist extension      Wrist ulnar deviation      Wrist radial deviation      Wrist pronation      Wrist supination       (Blank rows = not tested)  UPPER EXTREMITY MMT:  MMT Right eval Left eval Right 06/16/23 Left 06/16/23  Shoulder flexion 4- 3+ 4 4-  Shoulder extension      Shoulder abduction 4- 4- 4 4  Shoulder adduction      Shoulder extension      Shoulder internal rotation 4 4 4+ 4+  Shoulder external rotation 4 4 4+ 4+  Middle trapezius 3+ 3+ 4 4  Lower trapezius 3+ 3+ 4- 4-  Elbow flexion      Elbow extension      Wrist flexion      Wrist extension      Wrist ulnar deviation      Wrist radial deviation      Wrist pronation      Wrist supination      Grip strength       (Blank rows = not tested)    TODAY'S TREATMENT:      [facility] Adult PT Treatment:  DATE: 06/16/2023  Therapeutic Exercise: UBE level 2.5, 3'/3' fwd/back  Shoulder Rows, blue TB, 2 x 15  Pull downs/shoulder ext, blue TB, 2 x 15  Standing Open books x 5 each side   Therapeutic Activity:  Reassessment of objective measures and subjective assessment regarding progress towards established goals and plan for independence with prescribed home program following discharged from PT     [facility] Adult PT Treatment:                                                 DATE: 06/09/2023  Therapeutic Exercise: UBE level 2.5, 2'/2' fwd/back  Shoulder Rows, blue TB, 2 x 15  Pull downs/shoulder ext, blue TB, 2 x 15   Seated Shoulder Diagonal Horizontal Abduction, green band, x 15 BIL Seated BIL ER with scap retraction green TB 2x10 Shoulder Rolls fwd/back x 10 each  Seated Cervical Retraction into ball, 3 sec hold x 20  Seated Gentle Levator Scapulae Stretch, 2 x 30 sec BIL Standing Open Books.      PATIENT EDUCATION:  Education details: regarding prognosis/rehab potential, identified deficits and related treatment plan Person educated: Patient Education method: Explanation, Demonstration, and Handouts Education comprehension: verbalized understanding, returned demonstration, and needs further education  HOME EXERCISE PROGRAM: Access Code: [email or web address] URL: [email or web address] Date: 06/16/2023 Prepared by: Mauri Reading  Exercises - Standing Cervical Retraction  - 1 x daily - 3-4 x weekly - 2 sets - 10 reps - 3 sec hold - Seated Gentle Upper Trapezius Stretch  - 1 x daily - 3-4 x weekly - 3 sets - 30 sec hold - Doorway Pec Stretch at 90 Degrees Abduction  - 1 x daily - 3-4 x weekly - 3 sets - 30 sec hold - Shoulder extension with resistance - Neutral  - 1 x daily - 3-4 x weekly - 2 sets - 10 reps - Standing Shoulder Row with Anchored Resistance  - 1 x daily - 3-4 x weekly - 2 sets - 15 reps - Standing Shoulder Horizontal Abduction with Resistance  - 1 x daily - 3-4 x weekly - 2 sets - 10 reps - Standing Shoulder Diagonal Horizontal Abduction 60/120 Degrees with Resistance  - 1 x daily - 3-4 x weekly - 2 sets - 10 reps - Shoulder External Rotation and Scapular Retraction with Resistance  - 1 x daily - 3-4 x weekly - 2 sets - 10 reps - Wall Angels  - 1 x daily - 3-4 x weekly - 2 sets - 10 reps - Standing Thoracic Open Book at Wall  - 1 x daily - 3-4 x weekly - 2 sets - 10 reps - 3 sec  hold  ASSESSMENT:  CLINICAL IMPRESSION: Anachristina is demonstrating some improvement with UE strength and CS AROM. However, she continues to report moderate pain severity and demonstrating limited mobility. Patient is ready for d/c from skilled PT and is hoping to get her breast reduction consult scheduled. Provided patient with updated home exercise program for ongoing improvement of upper quarter mobility and strength.     OBJECTIVE IMPAIRMENTS: decreased activity tolerance, decreased endurance, decreased ROM, decreased strength, impaired UE functional use, postural dysfunction, obesity, and pain.   ACTIVITY LIMITATIONS: carrying, lifting, sleeping, and reach over head  PARTICIPATION LIMITATIONS: meal prep, cleaning, community activity, and occupation  PERSONAL FACTORS: Time since onset of injury/illness/exacerbation and 1-2  comorbidities: PMHx includes obesity, symptomatic mammary hypertrophy, and HTN  are also affecting patient's functional outcome.   REHAB POTENTIAL: Fair    CLINICAL DECISION MAKING: Stable/uncomplicated  EVALUATION COMPLEXITY: Low   GOALS: Goals reviewed with patient? Yes  SHORT TERM GOALS: Target date: 05/26/2023   Patient will be independent with initial home program for cervical/thoracic mobility and UQ strengthening.  Baseline: provided at eval  Goal status: MET    LONG TERM GOALS: Target date: 06/16/2023   Patient will report improved overall functional ability with FOTO score of 61 or greater Baseline: 51 06/16/23: 59 Goal status: NOT MET  2.  Patient will demonstrate improved UQ strength to 4+/5 MMT bilaterally.  Baseline: see objective findings 06/16/23: some strength deficits remaining Goal status: NOT MET  3.  Patient will demonstrate 75% or greater CS AROM in all directions with <3/10 pain at end range.  Baseline: see objective findings.  Goal status: NOT MET   4.  Patient will be independent with HEP for ongoing strengthening and pain  management as able.  Baseline: to be progressed throughout POC 06/16/23: ongoing  Goal status: MET    PLAN:  PT FREQUENCY: 1-2x/week  PT DURATION: 6 weeks  PLANNED INTERVENTIONS: Therapeutic exercises, Therapeutic activity, Neuromuscular re-education, Patient/Family education, Self Care, Joint mobilization, Dry Needling, Electrical stimulation, Spinal mobilization, Cryotherapy, Moist heat, Taping, Manual therapy, and Re-evaluation    Mauri Reading, PT, DPT 06/16/2023, 4:07 PM

## 2023-07-26 ENCOUNTER — Ambulatory Visit: Payer: BC Managed Care – PPO | Admitting: Student

## 2023-08-09 ENCOUNTER — Ambulatory Visit (INDEPENDENT_AMBULATORY_CARE_PROVIDER_SITE_OTHER): Payer: BC Managed Care – PPO | Admitting: Student

## 2023-08-09 ENCOUNTER — Encounter: Payer: Self-pay | Admitting: Student

## 2023-08-09 VITALS — BP 137/85 | HR 94 | Ht 67.5 in | Wt 230.6 lb

## 2023-08-09 DIAGNOSIS — N62 Hypertrophy of breast: Secondary | ICD-10-CM

## 2023-08-09 DIAGNOSIS — R21 Rash and other nonspecific skin eruption: Secondary | ICD-10-CM

## 2023-08-09 DIAGNOSIS — M542 Cervicalgia: Secondary | ICD-10-CM

## 2023-08-09 NOTE — Progress Notes (Signed)
   Referring Provider Jim Like, NP [address] [place],  [place] [address]   CC:  Chief Complaint  Patient presents with   Follow-up      Vicki French is an 57 y.o. female.  HPI: Patient is a 57 y.o. year old female here for follow up after completing physical therapy for pain related to macromastia.   She was seen for initial consult by Dr. Ulice Bold on 02/05/2023.  At that time, patient complained of upper neck and back pain related to her enlarged breast.  Her STN on the right was 43 cm and her STN on the left was 43 cm.  Her BMI was 36.2 kg/m and her weight was 231 pounds.  Her preoperative bra size was in H cup.  The patient was found to be a good candidate for bilateral breast reduction with possible liposuction.   Today, patient reports she is doing well.  She states that she completed 6 weeks of physical therapy.  She states although that she completed 6 weeks of physical therapy, she is still having shoulder pain and neck pain.  She states she also gets rashes underneath her breast.  Patient denies any recent changes in her health.  She states that she is a prediabetic and her most recent A1c was 6.4.  She states that she is still interested in having a breast reduction.   Review of Systems General: Denies any recent fevers or chills MSK: Endorses ongoing back and neck discomfort Skin: Reports intermittent rashes  Physical Exam    08/09/2023    3:02 PM 02/05/2023    8:53 AM 01/27/2021    9:13 AM  Vitals with BMI  Height 5' 7.5" 5' 7.5" 5' 7.5"  Weight 230 lbs 10 oz 233 lbs 6 oz 231 lbs  BMI 35.56 36 35.62  Systolic 137 139 563  Diastolic 85 86 85  Pulse 94 93 87    General:  No acute distress,  Alert and oriented, Non-Toxic, Normal speech and affect Psych: Normal behavior and mood Respiratory: No increased WOB MSK: Ambulatory  Assessment/Plan  Patient is interested in pursuing surgical intervention for bilateral breast reduction. Patient has completed at  least 6 weeks of physical therapy for pain related to macromastia.  Discussed with patient we would submit to insurance for authorization, discussed approval could take up to 6 weeks.   Discussed with patient to continue to work on her A1c with her primary care provider.  Discussed with her that if her A1c continues to increase, her surgery may be delayed.  Patient expressed understanding.  Laurena Spies 08/09/2023, 3:11 PM

## 2023-08-10 NOTE — Addendum Note (Signed)
Addended by: Caroline More on: 08/10/2023 11:16 AM   Modules accepted: Orders

## 2023-11-01 ENCOUNTER — Encounter: Payer: Self-pay | Admitting: Student

## 2023-11-01 ENCOUNTER — Ambulatory Visit (INDEPENDENT_AMBULATORY_CARE_PROVIDER_SITE_OTHER): Payer: BC Managed Care – PPO | Admitting: Student

## 2023-11-01 VITALS — BP 148/87 | HR 98 | Ht 67.5 in | Wt 235.6 lb

## 2023-11-01 DIAGNOSIS — N62 Hypertrophy of breast: Secondary | ICD-10-CM

## 2023-11-01 MED ORDER — CEPHALEXIN 500 MG PO CAPS: 500.0000 mg | ORAL_CAPSULE | Freq: Four times a day (QID) | ORAL | 0 refills | Status: AC

## 2023-11-01 MED ORDER — OXYCODONE HCL 5 MG PO TABS: 5.0000 mg | ORAL_TABLET | Freq: Four times a day (QID) | ORAL | 0 refills | Status: AC | PRN

## 2023-11-01 MED ORDER — ONDANSETRON HCL 4 MG PO TABS: 4.0000 mg | ORAL_TABLET | Freq: Three times a day (TID) | ORAL | 0 refills | Status: AC | PRN

## 2023-11-01 NOTE — Progress Notes (Signed)
Patient ID: Vicki French, female    DOB: 09/17/1966, 57 y.o.   MRN: [id number]  Chief Complaint  Patient presents with   Pre-op Exam      ICD-10-CM   1. Symptomatic mammary hypertrophy  N62        History of Present Illness: Vicki French is a 57 y.o.  female  with a history of macromastia.  She presents for preoperative evaluation for upcoming procedure, Bilateral Breast Reduction with liposuction, scheduled for 12/01/23 with Dr.  Ulice Bold  The patient has not had problems with anesthesia.  Patient denies any personal family history of breast cancer.  She denies any history of cardiac disease.  She denies taking any blood thinners.  Patient reports she is not a smoker.  Patient denies taking any birth control or hormone replacement.  She does report history of 1 miscarriage.  She denies any personal family history of blood clots or clotting diseases.  She denies any recent traumas, surgeries, infections.  She denies any history of stroke or heart attack.  She denies any history of Crohn's disease, ulcerative colitis, COPD, asthma.  She denies any history of cancer.  She denies any varicosities to her lower extremities.  She denies any recent fevers, chills or changes in her health.  Patient reports she is currently in H cup.  She states that she would like to be a C cup.  Discussed with patient that cup size cannot be guaranteed.  Patient expressed understanding.  Summary of Previous Visit: Patient was seen for consult by Dr. Ulice Bold on 02/05/2023.  At this visit, patient reported she had back pain and neck pain due to her enlarged breasts.  Her STN on the right was 43 cm and her STN on the left was 43 cm.  Her BMI was 36.2 kg/m.  Her preoperative bra size was in H cup, patient reported she would like to be a C cup.  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  Plan  for 6 weeks off.  PMH Significant for: Prediabetes, macromastia  Patient's blood pressure mildly elevated today.  She denies any chest pain, shortness of breath, headaches or blurred vision.  She does state that she has a blood pressure cuff at home.  Recommended that she retake her blood pressure later today.  Discussed with her if it remains elevated to reach out to her PCP.  Patient expressed understanding.   Past Medical History: Allergies: No Known Allergies  Current Medications:  Current Outpatient Medications:    amLODipine (NORVASC) 5 MG tablet, Take 1 tablet (5 mg total) by mouth daily., Disp: 90 tablet, Rfl: 2   rosuvastatin (CRESTOR) 5 MG tablet, Take 5 mg by mouth daily., Disp: , Rfl:   Past Medical Problems: Past Medical History:  Diagnosis Date   Allergy    Arthritis    left ankle   Atrial flutter (HCC)    ruled out by Dr Mariah Milling   Fibroid tumor 2007   removed from uterus    Hypertension    Nipple discharge in female    left   Prediabetes     Past Surgical History: Past Surgical History:  Procedure Laterality Date   BREAST BIOPSY Left 09/27/2015   U/S Core   BREAST BIOPSY Left 2018   x2   BREAST DUCTAL SYSTEM EXCISION Left 07/23/2017   Procedure: LEFT BREAST  SUBAREOLAR DUCTAL EXCISION;  Surgeon: Griselda Miner, MD;  Location: [facility];  Service: General;  Laterality: Left;   BREAST EXCISIONAL BIOPSY Left    UTERINE FIBROID SURGERY      Social History: Social History   Socioeconomic History   Marital status: Single    Spouse name: Not on file   Number of children: Not on file   Years of education: Not on file   Highest education level: Not on file  Occupational History   Not on file  Tobacco Use   Smoking status: Never   Smokeless tobacco: Never  Substance and Sexual Activity   Alcohol use: No   Drug use: No   Sexual activity: Not on file  Other Topics Concern   Not on file  Social History Narrative   Not on file    Social Drivers of Health   Financial Resource Strain: Not on file  Food Insecurity: Not on file  Transportation Needs: Not on file  Physical Activity: Not on file  Stress: Not on file  Social Connections: Not on file  Intimate Partner Violence: Not on file    Family History: Family History  Problem Relation Age of Onset   Hypertension Mother    Hyperlipidemia Mother    Diabetes Mother    Hypertension Father    Hyperlipidemia Father    Cancer Father     Review of Systems: Denies any recent fevers, chills or changes in her health  Physical Exam: Vital Signs BP (!) 148/87 (BP Location: Left Arm, Patient Position: Sitting, Cuff Size: Large)   Pulse 98   Ht 5' 7.5" (1.715 m)   Wt 235 lb 9.6 oz (106.9 kg)   SpO2 98%   BMI 36.36 kg/m   Physical Exam  Constitutional:      General: Not in acute distress.    Appearance: Normal appearance. Not ill-appearing.  HENT:     Head: Normocephalic and atraumatic.  Neck:     Musculoskeletal: Normal range of motion.  Cardiovascular:     Rate and Rhythm: Normal rate Pulmonary:     Effort: Pulmonary effort is normal. No respiratory distress.  Musculoskeletal: Normal range of motion.  Skin:    General: Skin is warm and dry.     Findings: No erythema or rash.  Neurological:     Mental Status: Alert and oriented to person, place, and time. Mental status is at baseline.  Psychiatric:        Mood and Affect: Mood normal.        Behavior: Behavior normal.    Assessment/Plan: The patient is scheduled for bilateral breast reduction with Dr. Ulice Bold.  Risks, benefits, and alternatives of procedure discussed, questions answered and consent obtained.    Smoking Status: Non-smoker; Counseling Given?  N/A Last Mammogram: 05/10/2023; Results: BI-RADS Category 1  Caprini Score: 4; Risk Factors include: Age, BMI > 25, and length of planned surgery. Recommendation for mechanical prophylaxis. Encourage early ambulation.   Pictures  obtained: @consult   Post-op Rx sent to pharmacy: Oxycodone, Zofran, Keflex  Patient was provided with the breast reduction and General Surgical Risk consent document and Pain Medication Agreement prior to their appointment.  They had adequate time to read through the risk consent documents and Pain Medication Agreement. We also discussed them in person together during this preop appointment. All of their questions were answered to their satisfaction.  Recommended calling if they have any further questions.  Risk consent form and Pain Medication Agreement to  be scanned into patient's chart.  The risk that can be encountered with breast reduction were discussed and include the following but not limited to these:  Breast asymmetry, fluid accumulation, firmness of the breast, inability to breast feed, loss of nipple or areola, skin loss, decrease or no nipple sensation, fat necrosis of the breast tissue, bleeding, infection, healing delay.  There are risks of anesthesia, changes to skin sensation and injury to nerves or blood vessels.  The muscle can be temporarily or permanently injured.  You may have an allergic reaction to tape, suture, glue, blood products which can result in skin discoloration, swelling, pain, skin lesions, poor healing.  Any of these can lead to the need for revisonal surgery or stage procedures.  A reduction has potential to interfere with diagnostic procedures.  Nipple or breast piercing can increase risks of infection.  This procedure is best done when the breast is fully developed.  Changes in the breast will continue to occur over time.  Pregnancy can alter the outcomes of previous breast reduction surgery, weight gain and weigh loss can also effect the long term appearance.   We discussed the possibility of amputation/free nipple graft technique due to the length of her STN.  She is understanding of the possibility that we would need to transition from a pedicle technique to a free  nipple graft technique intraoperatively.  We discussed the risks associated with free nipple graft breast reductions, including but not limited to failure of the graft, partial loss of the graft, loss of sensation of bilateral nipple areola, complete loss of the nipple areola graft, inability to breast-feed, postoperative wounds, ongoing wound care.  We also discussed the risks associated with the pedicle technique.  We discussed that with the pedicle technique she could develop nipple areolar necrosis which would result in loss of the nipple, this would also result in ongoing wound care and possible changes in the shape of her breast.     Electronically signed by: Laurena Spies, PA-C 11/01/2023 12:35 PM

## 2023-11-01 NOTE — H&P (View-Only) (Signed)
 Patient ID: Vicki French, female    DOB: 09/17/1966, 57 y.o.   MRN: [id number]  Chief Complaint  Patient presents with   Pre-op Exam      ICD-10-CM   1. Symptomatic mammary hypertrophy  N62        History of Present Illness: Vicki French is a 57 y.o.  female  with a history of macromastia.  She presents for preoperative evaluation for upcoming procedure, Bilateral Breast Reduction with liposuction, scheduled for 12/01/23 with Dr.  Ulice Bold  The patient has not had problems with anesthesia.  Patient denies any personal family history of breast cancer.  She denies any history of cardiac disease.  She denies taking any blood thinners.  Patient reports she is not a smoker.  Patient denies taking any birth control or hormone replacement.  She does report history of 1 miscarriage.  She denies any personal family history of blood clots or clotting diseases.  She denies any recent traumas, surgeries, infections.  She denies any history of stroke or heart attack.  She denies any history of Crohn's disease, ulcerative colitis, COPD, asthma.  She denies any history of cancer.  She denies any varicosities to her lower extremities.  She denies any recent fevers, chills or changes in her health.  Patient reports she is currently in H cup.  She states that she would like to be a C cup.  Discussed with patient that cup size cannot be guaranteed.  Patient expressed understanding.  Summary of Previous Visit: Patient was seen for consult by Dr. Ulice Bold on 02/05/2023.  At this visit, patient reported she had back pain and neck pain due to her enlarged breasts.  Her STN on the right was 43 cm and her STN on the left was 43 cm.  Her BMI was 36.2 kg/m.  Her preoperative bra size was in H cup, patient reported she would like to be a C cup.  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  Plan  for 6 weeks off.  PMH Significant for: Prediabetes, macromastia  Patient's blood pressure mildly elevated today.  She denies any chest pain, shortness of breath, headaches or blurred vision.  She does state that she has a blood pressure cuff at home.  Recommended that she retake her blood pressure later today.  Discussed with her if it remains elevated to reach out to her PCP.  Patient expressed understanding.   Past Medical History: Allergies: No Known Allergies  Current Medications:  Current Outpatient Medications:    amLODipine (NORVASC) 5 MG tablet, Take 1 tablet (5 mg total) by mouth daily., Disp: 90 tablet, Rfl: 2   rosuvastatin (CRESTOR) 5 MG tablet, Take 5 mg by mouth daily., Disp: , Rfl:   Past Medical Problems: Past Medical History:  Diagnosis Date   Allergy    Arthritis    left ankle   Atrial flutter (HCC)    ruled out by Dr Mariah Milling   Fibroid tumor 2007   removed from uterus    Hypertension    Nipple discharge in female    left   Prediabetes     Past Surgical History: Past Surgical History:  Procedure Laterality Date   BREAST BIOPSY Left 09/27/2015   U/S Core   BREAST BIOPSY Left 2018   x2   BREAST DUCTAL SYSTEM EXCISION Left 07/23/2017   Procedure: LEFT BREAST  SUBAREOLAR DUCTAL EXCISION;  Surgeon: Griselda Miner, MD;  Location: [facility];  Service: General;  Laterality: Left;   BREAST EXCISIONAL BIOPSY Left    UTERINE FIBROID SURGERY      Social History: Social History   Socioeconomic History   Marital status: Single    Spouse name: Not on file   Number of children: Not on file   Years of education: Not on file   Highest education level: Not on file  Occupational History   Not on file  Tobacco Use   Smoking status: Never   Smokeless tobacco: Never  Substance and Sexual Activity   Alcohol use: No   Drug use: No   Sexual activity: Not on file  Other Topics Concern   Not on file  Social History Narrative   Not on file    Social Drivers of Health   Financial Resource Strain: Not on file  Food Insecurity: Not on file  Transportation Needs: Not on file  Physical Activity: Not on file  Stress: Not on file  Social Connections: Not on file  Intimate Partner Violence: Not on file    Family History: Family History  Problem Relation Age of Onset   Hypertension Mother    Hyperlipidemia Mother    Diabetes Mother    Hypertension Father    Hyperlipidemia Father    Cancer Father     Review of Systems: Denies any recent fevers, chills or changes in her health  Physical Exam: Vital Signs BP (!) 148/87 (BP Location: Left Arm, Patient Position: Sitting, Cuff Size: Large)   Pulse 98   Ht 5' 7.5" (1.715 m)   Wt 235 lb 9.6 oz (106.9 kg)   SpO2 98%   BMI 36.36 kg/m   Physical Exam  Constitutional:      General: Not in acute distress.    Appearance: Normal appearance. Not ill-appearing.  HENT:     Head: Normocephalic and atraumatic.  Neck:     Musculoskeletal: Normal range of motion.  Cardiovascular:     Rate and Rhythm: Normal rate Pulmonary:     Effort: Pulmonary effort is normal. No respiratory distress.  Musculoskeletal: Normal range of motion.  Skin:    General: Skin is warm and dry.     Findings: No erythema or rash.  Neurological:     Mental Status: Alert and oriented to person, place, and time. Mental status is at baseline.  Psychiatric:        Mood and Affect: Mood normal.        Behavior: Behavior normal.    Assessment/Plan: The patient is scheduled for bilateral breast reduction with Dr. Ulice Bold.  Risks, benefits, and alternatives of procedure discussed, questions answered and consent obtained.    Smoking Status: Non-smoker; Counseling Given?  N/A Last Mammogram: 05/10/2023; Results: BI-RADS Category 1  Caprini Score: 4; Risk Factors include: Age, BMI > 25, and length of planned surgery. Recommendation for mechanical prophylaxis. Encourage early ambulation.   Pictures  obtained: @consult   Post-op Rx sent to pharmacy: Oxycodone, Zofran, Keflex  Patient was provided with the breast reduction and General Surgical Risk consent document and Pain Medication Agreement prior to their appointment.  They had adequate time to read through the risk consent documents and Pain Medication Agreement. We also discussed them in person together during this preop appointment. All of their questions were answered to their satisfaction.  Recommended calling if they have any further questions.  Risk consent form and Pain Medication Agreement to  be scanned into patient's chart.  The risk that can be encountered with breast reduction were discussed and include the following but not limited to these:  Breast asymmetry, fluid accumulation, firmness of the breast, inability to breast feed, loss of nipple or areola, skin loss, decrease or no nipple sensation, fat necrosis of the breast tissue, bleeding, infection, healing delay.  There are risks of anesthesia, changes to skin sensation and injury to nerves or blood vessels.  The muscle can be temporarily or permanently injured.  You may have an allergic reaction to tape, suture, glue, blood products which can result in skin discoloration, swelling, pain, skin lesions, poor healing.  Any of these can lead to the need for revisonal surgery or stage procedures.  A reduction has potential to interfere with diagnostic procedures.  Nipple or breast piercing can increase risks of infection.  This procedure is best done when the breast is fully developed.  Changes in the breast will continue to occur over time.  Pregnancy can alter the outcomes of previous breast reduction surgery, weight gain and weigh loss can also effect the long term appearance.   We discussed the possibility of amputation/free nipple graft technique due to the length of her STN.  She is understanding of the possibility that we would need to transition from a pedicle technique to a free  nipple graft technique intraoperatively.  We discussed the risks associated with free nipple graft breast reductions, including but not limited to failure of the graft, partial loss of the graft, loss of sensation of bilateral nipple areola, complete loss of the nipple areola graft, inability to breast-feed, postoperative wounds, ongoing wound care.  We also discussed the risks associated with the pedicle technique.  We discussed that with the pedicle technique she could develop nipple areolar necrosis which would result in loss of the nipple, this would also result in ongoing wound care and possible changes in the shape of her breast.     Electronically signed by: Laurena Spies, PA-C 11/01/2023 12:35 PM

## 2023-11-18 IMAGING — MG MM DIGITAL SCREENING BILAT W/ TOMO AND CAD
8 of 15 series · 8 of 40 positions shown · non-contrast
Comparison: Previous exam(s).

CLINICAL DATA: Screening.

EXAM:
DIGITAL SCREENING BILATERAL MAMMOGRAM WITH TOMOSYNTHESIS AND CAD
TECHNIQUE: Bilateral screening digital craniocaudal and mediolateral oblique
mammograms were obtained. Bilateral screening digital breast
tomosynthesis was performed. The images were evaluated with
computer-aided detection.

[R CC synth-2D (1 of 2)]
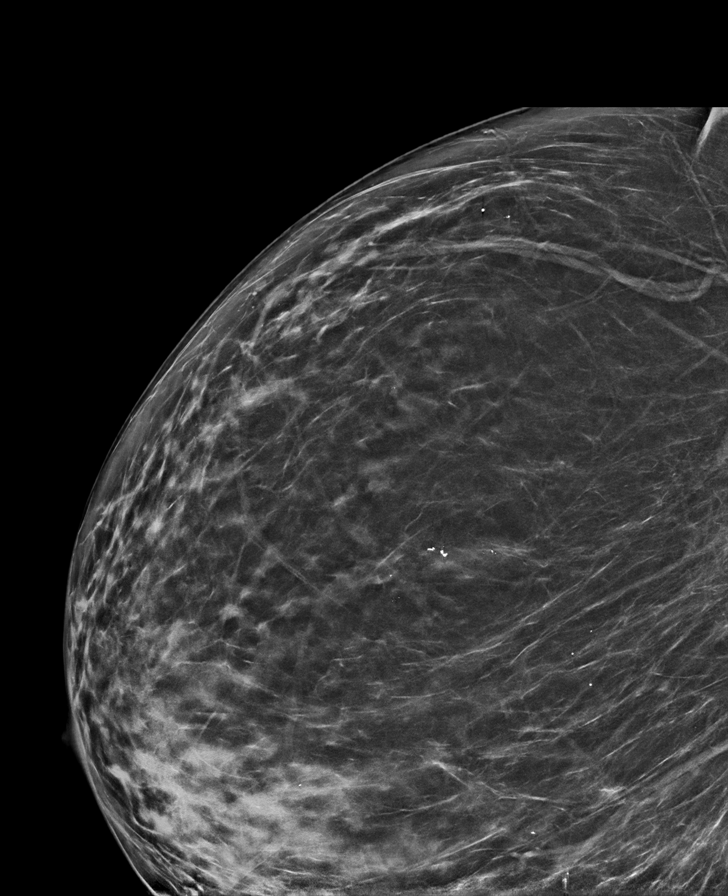

[L CC synth-2D]
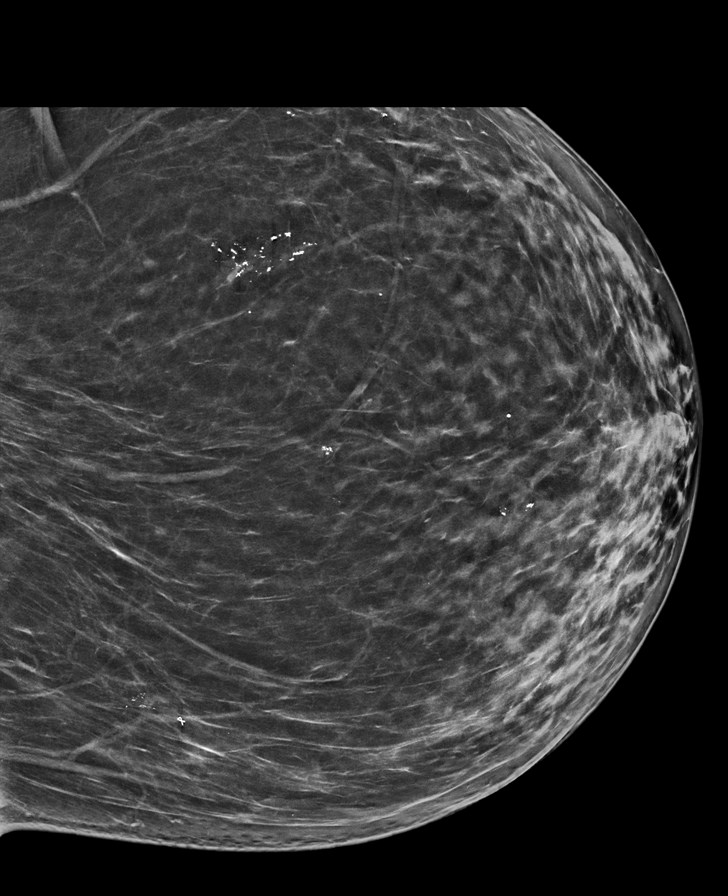

[R MLO synth-2D (1 of 2)]
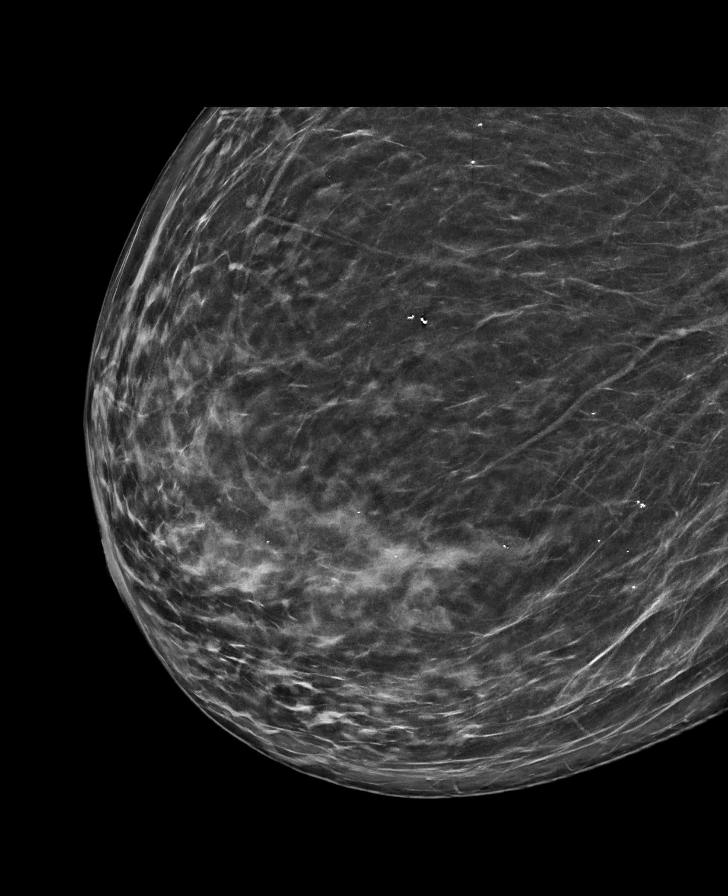

[R CC synth-2D (2 of 2)]
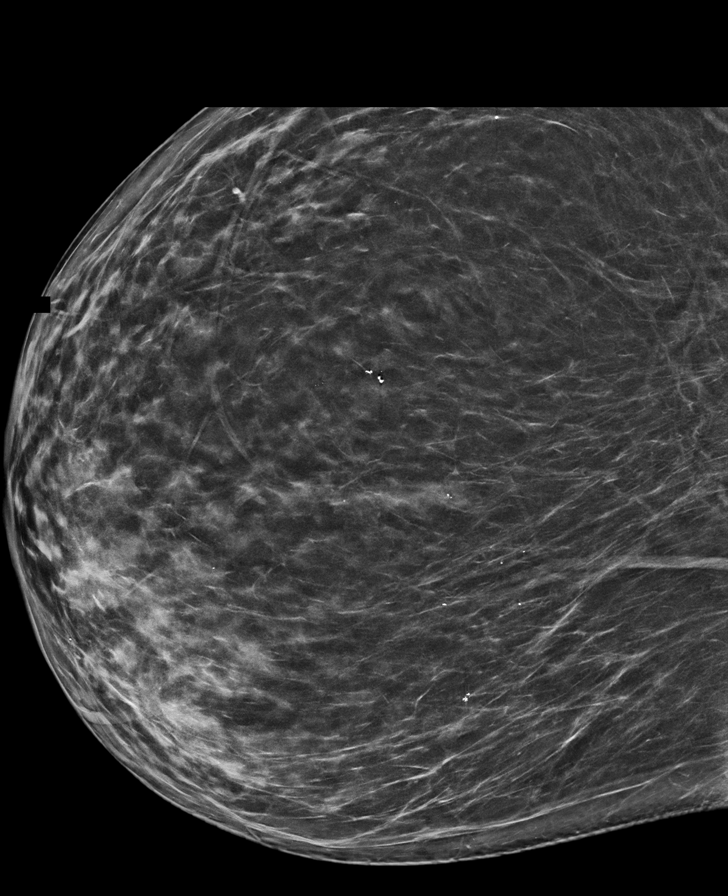

[R MLO synth-2D (2 of 2)]
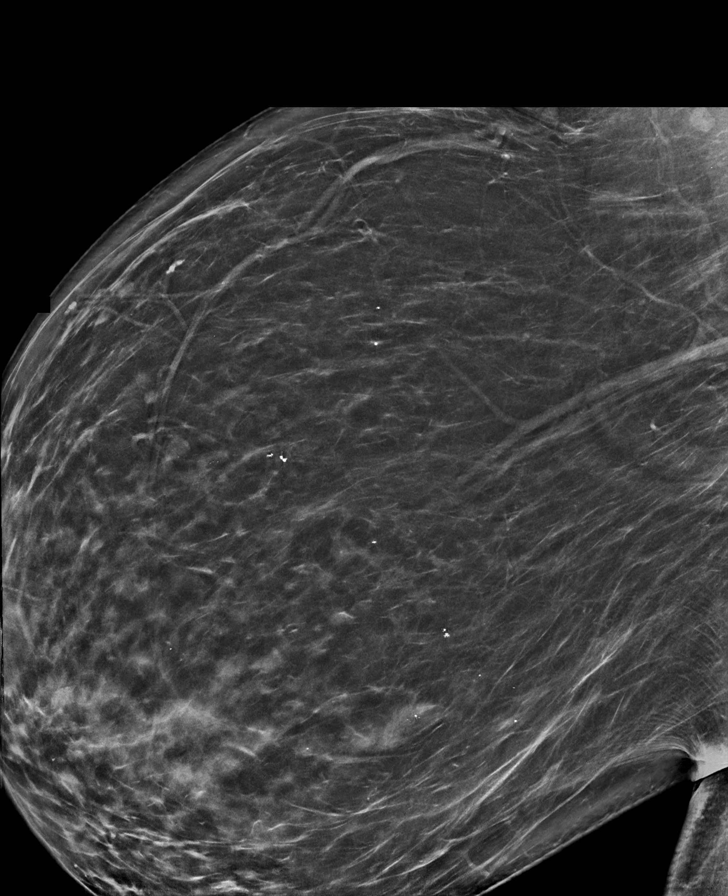

[L MLO synth-2D (1 of 2)]
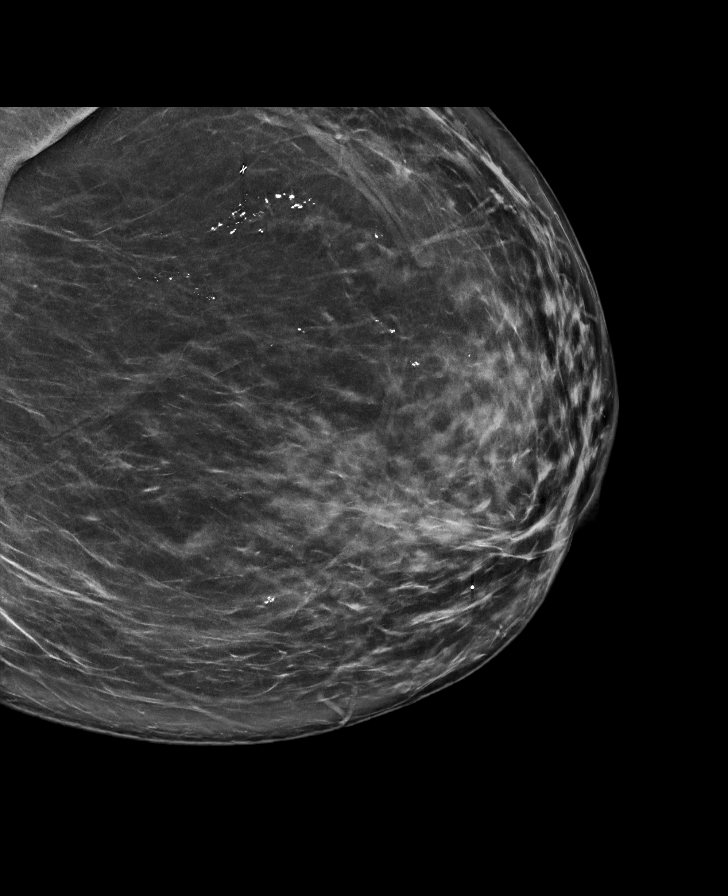

[L MLO synth-2D (2 of 2)]
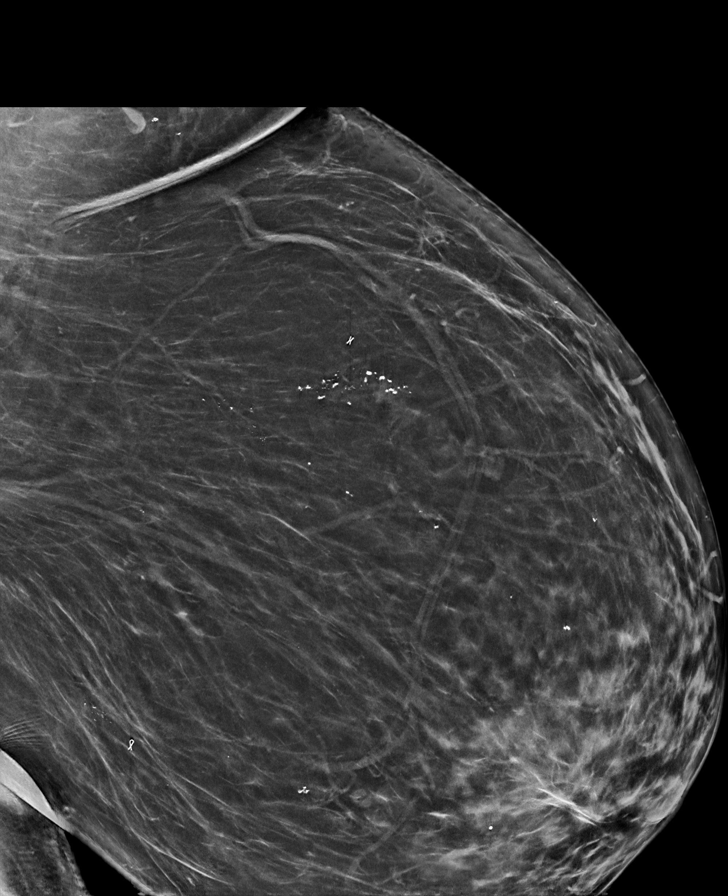

[L MLO tomo · tomo slice 64/93.0]
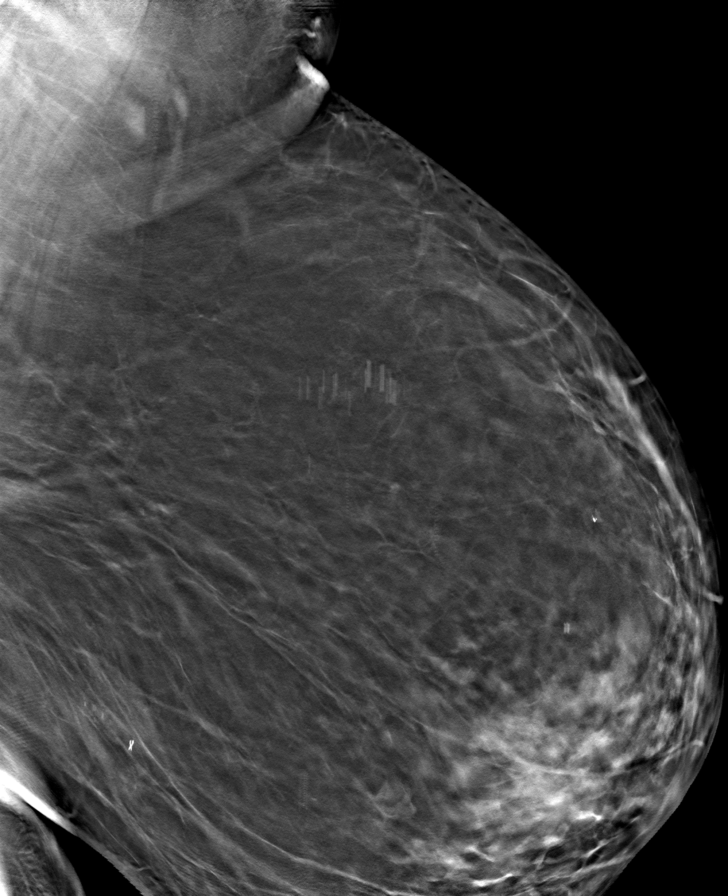

[8 of 40 positions shown; findings below may reference images not displayed]

ACR Breast Density Category b: There are scattered areas of
fibroglandular density.
FINDINGS: There are no findings suspicious for malignancy.
IMPRESSION: No mammographic evidence of malignancy. A result letter of this
screening mammogram will be mailed directly to the patient.

RECOMMENDATION:
Screening mammogram in one year. (Code:51-O-LD2)

BI-RADS CATEGORY  1: Negative.

## 2023-11-24 ENCOUNTER — Encounter (HOSPITAL_BASED_OUTPATIENT_CLINIC_OR_DEPARTMENT_OTHER): Payer: Self-pay | Admitting: Plastic Surgery

## 2023-11-24 ENCOUNTER — Other Ambulatory Visit: Payer: Self-pay

## 2023-11-30 ENCOUNTER — Encounter (HOSPITAL_BASED_OUTPATIENT_CLINIC_OR_DEPARTMENT_OTHER)
Admission: RE | Admit: 2023-11-30 | Discharge: 2023-11-30 | Disposition: A | Discharge status: Home / Self Care | Payer: BC Managed Care – PPO | Source: Ambulatory Visit | Attending: Plastic Surgery | Admitting: Plastic Surgery

## 2023-11-30 DIAGNOSIS — N62 Hypertrophy of breast: Secondary | ICD-10-CM | POA: Diagnosis present

## 2023-11-30 DIAGNOSIS — M1990 Unspecified osteoarthritis, unspecified site: Secondary | ICD-10-CM | POA: Diagnosis not present

## 2023-11-30 DIAGNOSIS — I10 Essential (primary) hypertension: Secondary | ICD-10-CM | POA: Insufficient documentation

## 2023-11-30 DIAGNOSIS — M549 Dorsalgia, unspecified: Secondary | ICD-10-CM | POA: Diagnosis not present

## 2023-11-30 DIAGNOSIS — Z8249 Family history of ischemic heart disease and other diseases of the circulatory system: Secondary | ICD-10-CM | POA: Diagnosis not present

## 2023-11-30 DIAGNOSIS — N6031 Fibrosclerosis of right breast: Secondary | ICD-10-CM | POA: Diagnosis not present

## 2023-11-30 DIAGNOSIS — N6032 Fibrosclerosis of left breast: Secondary | ICD-10-CM | POA: Diagnosis not present

## 2023-11-30 DIAGNOSIS — Z01810 Encounter for preprocedural cardiovascular examination: Secondary | ICD-10-CM | POA: Insufficient documentation

## 2023-11-30 DIAGNOSIS — M542 Cervicalgia: Secondary | ICD-10-CM | POA: Diagnosis not present

## 2023-12-01 ENCOUNTER — Ambulatory Visit (HOSPITAL_BASED_OUTPATIENT_CLINIC_OR_DEPARTMENT_OTHER): Payer: BC Managed Care – PPO

## 2023-12-01 ENCOUNTER — Ambulatory Visit (HOSPITAL_BASED_OUTPATIENT_CLINIC_OR_DEPARTMENT_OTHER)
Admission: RE | Admit: 2023-12-01 | Discharge: 2023-12-01 | Disposition: A | Discharge status: Home / Self Care | Payer: BC Managed Care – PPO | Attending: Plastic Surgery | Admitting: Plastic Surgery

## 2023-12-01 ENCOUNTER — Encounter (HOSPITAL_BASED_OUTPATIENT_CLINIC_OR_DEPARTMENT_OTHER)
Admission: RE | Disposition: A | Discharge status: Home / Self Care | Payer: Self-pay | Source: Home / Self Care | Attending: Plastic Surgery

## 2023-12-01 ENCOUNTER — Encounter (HOSPITAL_BASED_OUTPATIENT_CLINIC_OR_DEPARTMENT_OTHER): Payer: Self-pay | Admitting: Plastic Surgery

## 2023-12-01 ENCOUNTER — Other Ambulatory Visit: Payer: Self-pay

## 2023-12-01 DIAGNOSIS — N6031 Fibrosclerosis of right breast: Secondary | ICD-10-CM | POA: Insufficient documentation

## 2023-12-01 DIAGNOSIS — M549 Dorsalgia, unspecified: Secondary | ICD-10-CM | POA: Insufficient documentation

## 2023-12-01 DIAGNOSIS — M542 Cervicalgia: Secondary | ICD-10-CM | POA: Insufficient documentation

## 2023-12-01 DIAGNOSIS — I10 Essential (primary) hypertension: Secondary | ICD-10-CM | POA: Insufficient documentation

## 2023-12-01 DIAGNOSIS — N62 Hypertrophy of breast: Secondary | ICD-10-CM | POA: Insufficient documentation

## 2023-12-01 DIAGNOSIS — N6032 Fibrosclerosis of left breast: Secondary | ICD-10-CM | POA: Insufficient documentation

## 2023-12-01 DIAGNOSIS — M1990 Unspecified osteoarthritis, unspecified site: Secondary | ICD-10-CM | POA: Insufficient documentation

## 2023-12-01 DIAGNOSIS — Z8249 Family history of ischemic heart disease and other diseases of the circulatory system: Secondary | ICD-10-CM | POA: Insufficient documentation

## 2023-12-01 HISTORY — PX: BREAST REDUCTION SURGERY: SHX8

## 2023-12-01 HISTORY — DX: Hypertrophy of breast: N62

## 2023-12-01 HISTORY — DX: Prediabetes: R73.03

## 2023-12-01 SURGERY — BREAST REDUCTION WITH LIPOSUCTION
Anesthesia: General | Site: Breast | Laterality: Bilateral

## 2023-12-01 MED ORDER — CHLORHEXIDINE GLUCONATE 4 % EX SOLN: 1.0000 | Freq: Once | CUTANEOUS | Status: DC

## 2023-12-01 MED ORDER — FENTANYL CITRATE (PF) 100 MCG/2ML IJ SOLN
INTRAMUSCULAR | Status: AC
  Filled 2023-12-01: qty 2

## 2023-12-01 MED ORDER — SODIUM CHLORIDE 0.9 % IV SOLN: 250.0000 mL | INTRAVENOUS | Status: DC | PRN

## 2023-12-01 MED ORDER — FENTANYL CITRATE (PF) 100 MCG/2ML IJ SOLN
25.0000 ug | INTRAMUSCULAR | Status: DC | PRN
Start: 2023-12-01 — End: 2023-12-01

## 2023-12-01 MED ORDER — MIDAZOLAM HCL 2 MG/2ML IJ SOLN
INTRAMUSCULAR | Status: AC
Start: 2023-12-01 — End: ?
  Filled 2023-12-01: qty 2

## 2023-12-01 MED ORDER — SODIUM CHLORIDE 0.9 % IV SOLN
INTRAVENOUS | Status: DC | PRN
  Administered 2023-12-01: 40 mL

## 2023-12-01 MED ORDER — ACETAMINOPHEN 500 MG PO TABS
1000.0000 mg | ORAL_TABLET | Freq: Once | ORAL | Status: AC
  Administered 2023-12-01: 1000 mg via ORAL

## 2023-12-01 MED ORDER — EPHEDRINE 5 MG/ML INJ
INTRAVENOUS | Status: AC
  Filled 2023-12-01: qty 5

## 2023-12-01 MED ORDER — SUGAMMADEX SODIUM 200 MG/2ML IV SOLN
INTRAVENOUS | Status: DC | PRN
  Administered 2023-12-01: 200 mg via INTRAVENOUS

## 2023-12-01 MED ORDER — SODIUM CHLORIDE 0.9% FLUSH: 3.0000 mL | Freq: Two times a day (BID) | INTRAVENOUS | Status: DC

## 2023-12-01 MED ORDER — ONDANSETRON HCL 4 MG/2ML IJ SOLN
INTRAMUSCULAR | Status: DC | PRN
  Administered 2023-12-01: 4 mg via INTRAVENOUS

## 2023-12-01 MED ORDER — CEFAZOLIN SODIUM-DEXTROSE 2-4 GM/100ML-% IV SOLN
INTRAVENOUS | Status: AC
  Filled 2023-12-01: qty 100

## 2023-12-01 MED ORDER — PHENYLEPHRINE 80 MCG/ML (10ML) SYRINGE FOR IV PUSH (FOR BLOOD PRESSURE SUPPORT)
PREFILLED_SYRINGE | INTRAVENOUS | Status: AC
  Filled 2023-12-01: qty 10

## 2023-12-01 MED ORDER — DROPERIDOL 2.5 MG/ML IJ SOLN: 0.6250 mg | Freq: Once | INTRAMUSCULAR | Status: DC | PRN

## 2023-12-01 MED ORDER — LIDOCAINE 2% (20 MG/ML) 5 ML SYRINGE
INTRAMUSCULAR | Status: AC
  Filled 2023-12-01: qty 5

## 2023-12-01 MED ORDER — LIDOCAINE-EPINEPHRINE 1 %-1:100000 IJ SOLN
INTRAMUSCULAR | Status: DC | PRN
  Administered 2023-12-01: 50 mL

## 2023-12-01 MED ORDER — ACETAMINOPHEN 325 MG PO TABS: 650.0000 mg | ORAL_TABLET | ORAL | Status: DC | PRN

## 2023-12-01 MED ORDER — CEFAZOLIN SODIUM-DEXTROSE 2-4 GM/100ML-% IV SOLN
2.0000 g | INTRAVENOUS | Status: AC
  Administered 2023-12-01: 2 g via INTRAVENOUS

## 2023-12-01 MED ORDER — ACETAMINOPHEN 500 MG PO TABS
ORAL_TABLET | ORAL | Status: AC
  Filled 2023-12-01: qty 2

## 2023-12-01 MED ORDER — FENTANYL CITRATE (PF) 100 MCG/2ML IJ SOLN
INTRAMUSCULAR | Status: DC | PRN
  Administered 2023-12-01: 100 ug via INTRAVENOUS
  Administered 2023-12-01: 50 ug via INTRAVENOUS

## 2023-12-01 MED ORDER — VASHE WOUND IRRIGATION OPTIME
TOPICAL | Status: DC | PRN
  Administered 2023-12-01: 34 [oz_av]

## 2023-12-01 MED ORDER — PROPOFOL 10 MG/ML IV BOLUS
INTRAVENOUS | Status: DC | PRN
  Administered 2023-12-01: 150 mg via INTRAVENOUS

## 2023-12-01 MED ORDER — DEXAMETHASONE SODIUM PHOSPHATE 4 MG/ML IJ SOLN
INTRAMUSCULAR | Status: DC | PRN
  Administered 2023-12-01: 5 mg via INTRAVENOUS

## 2023-12-01 MED ORDER — ROCURONIUM BROMIDE 100 MG/10ML IV SOLN
INTRAVENOUS | Status: DC | PRN
  Administered 2023-12-01: 60 mg via INTRAVENOUS

## 2023-12-01 MED ORDER — LACTATED RINGERS IV SOLN: INTRAVENOUS | Status: DC

## 2023-12-01 MED ORDER — OXYCODONE HCL 5 MG/5ML PO SOLN: 5.0000 mg | Freq: Once | ORAL | Status: AC | PRN

## 2023-12-01 MED ORDER — MIDAZOLAM HCL 5 MG/5ML IJ SOLN
INTRAMUSCULAR | Status: DC | PRN
  Administered 2023-12-01: 2 mg via INTRAVENOUS

## 2023-12-01 MED ORDER — ACETAMINOPHEN 10 MG/ML IV SOLN: 1000.0000 mg | Freq: Once | INTRAVENOUS | Status: DC | PRN

## 2023-12-01 MED ORDER — ACETAMINOPHEN 325 MG RE SUPP
650.0000 mg | RECTAL | Status: DC | PRN
Start: 2023-12-01 — End: 2023-12-01

## 2023-12-01 MED ORDER — ROCURONIUM BROMIDE 10 MG/ML (PF) SYRINGE
PREFILLED_SYRINGE | INTRAVENOUS | Status: AC
  Filled 2023-12-01: qty 10

## 2023-12-01 MED ORDER — PHENYLEPHRINE HCL (PRESSORS) 10 MG/ML IV SOLN
INTRAVENOUS | Status: DC | PRN
  Administered 2023-12-01: 80 ug via INTRAVENOUS

## 2023-12-01 MED ORDER — OXYCODONE HCL 5 MG PO TABS
ORAL_TABLET | ORAL | Status: AC
  Filled 2023-12-01: qty 1

## 2023-12-01 MED ORDER — DEXAMETHASONE SODIUM PHOSPHATE 10 MG/ML IJ SOLN
INTRAMUSCULAR | Status: AC
  Filled 2023-12-01: qty 1

## 2023-12-01 MED ORDER — SUCCINYLCHOLINE CHLORIDE 200 MG/10ML IV SOSY
PREFILLED_SYRINGE | INTRAVENOUS | Status: AC
  Filled 2023-12-01: qty 10

## 2023-12-01 MED ORDER — ATROPINE SULFATE 0.4 MG/ML IV SOLN
INTRAVENOUS | Status: AC
  Filled 2023-12-01: qty 1

## 2023-12-01 MED ORDER — ONDANSETRON HCL 4 MG/2ML IJ SOLN
INTRAMUSCULAR | Status: AC
  Filled 2023-12-01: qty 2

## 2023-12-01 MED ORDER — LIDOCAINE HCL (CARDIAC) PF 100 MG/5ML IV SOSY
PREFILLED_SYRINGE | INTRAVENOUS | Status: DC | PRN
  Administered 2023-12-01: 60 mg via INTRAVENOUS

## 2023-12-01 MED ORDER — FENTANYL CITRATE (PF) 100 MCG/2ML IJ SOLN: 25.0000 ug | INTRAMUSCULAR | Status: DC | PRN

## 2023-12-01 MED ORDER — OXYCODONE HCL 5 MG PO TABS
5.0000 mg | ORAL_TABLET | ORAL | Status: DC | PRN
Start: 2023-12-01 — End: 2023-12-01

## 2023-12-01 MED ORDER — SODIUM CHLORIDE 0.9 % IV SOLN: INTRAVENOUS | Status: DC | PRN

## 2023-12-01 MED ORDER — HYDROMORPHONE HCL 1 MG/ML IJ SOLN
INTRAMUSCULAR | Status: AC
  Filled 2023-12-01: qty 0.5

## 2023-12-01 MED ORDER — HYDROMORPHONE HCL 1 MG/ML IJ SOLN
INTRAMUSCULAR | Status: DC | PRN
  Administered 2023-12-01: .5 mg via INTRAVENOUS

## 2023-12-01 MED ORDER — SODIUM CHLORIDE 0.9% FLUSH: 3.0000 mL | INTRAVENOUS | Status: DC | PRN

## 2023-12-01 MED ORDER — OXYCODONE HCL 5 MG PO TABS
5.0000 mg | ORAL_TABLET | Freq: Once | ORAL | Status: AC | PRN
  Administered 2023-12-01: 5 mg via ORAL

## 2023-12-01 MED ORDER — LIDOCAINE HCL 1 % IJ SOLN
INTRAVENOUS | Status: DC | PRN
  Administered 2023-12-01: 600 mL

## 2023-12-01 SURGICAL SUPPLY — 65 items
BINDER BREAST LRG (GAUZE/BANDAGES/DRESSINGS) IMPLANT
BINDER BREAST MEDIUM (GAUZE/BANDAGES/DRESSINGS) IMPLANT
BINDER BREAST XLRG (GAUZE/BANDAGES/DRESSINGS) IMPLANT
BINDER BREAST XXLRG (GAUZE/BANDAGES/DRESSINGS) IMPLANT
BIOPATCH RED 1 DISK 7.0 (GAUZE/BANDAGES/DRESSINGS) IMPLANT
BLADE HEX COATED 2.75 (ELECTRODE) IMPLANT
BLADE KNIFE PERSONA 10 (BLADE) ×2 IMPLANT
BLADE SURG 15 STRL LF DISP TIS (BLADE) ×1 IMPLANT
CANISTER SUCT 1200ML W/VALVE (MISCELLANEOUS) ×1 IMPLANT
CLEANSER WND VASHE 34 (WOUND CARE) ×1 IMPLANT
COLLAGEN CELLERATERX 5 GRAM (Miscellaneous) IMPLANT
COVER BACK TABLE 60X90IN (DRAPES) ×1 IMPLANT
COVER MAYO STAND STRL (DRAPES) ×1 IMPLANT
DERMABOND ADVANCED .7 DNX12 (GAUZE/BANDAGES/DRESSINGS) ×2 IMPLANT
DRAIN CHANNEL 19F RND (DRAIN) IMPLANT
DRAPE LAPAROSCOPIC ABDOMINAL (DRAPES) ×1 IMPLANT
DRAPE UTILITY XL STRL (DRAPES) ×1 IMPLANT
DRSG MEPILEX POST OP 4X8 (GAUZE/BANDAGES/DRESSINGS) ×2 IMPLANT
DRSG TEGADERM 4X4.75 (GAUZE/BANDAGES/DRESSINGS) IMPLANT
ELECT BLADE 4.0 EZ CLEAN MEGAD (MISCELLANEOUS) ×2
ELECT REM PT RETURN 9FT ADLT (ELECTROSURGICAL) ×1
ELECTRODE BLDE 4.0 EZ CLN MEGD (MISCELLANEOUS) ×1 IMPLANT
ELECTRODE REM PT RTRN 9FT ADLT (ELECTROSURGICAL) ×1 IMPLANT
EVACUATOR SILICONE 100CC (DRAIN) IMPLANT
GAUZE PAD ABD 8X10 STRL (GAUZE/BANDAGES/DRESSINGS) ×2 IMPLANT
GLOVE BIO SURGEON STRL SZ 6.5 (GLOVE) ×2 IMPLANT
GLOVE BIO SURGEON STRL SZ7.5 (GLOVE) ×1 IMPLANT
GLOVE BIOGEL PI IND STRL 7.0 (GLOVE) IMPLANT
GLOVE BIOGEL PI IND STRL 8 (GLOVE) IMPLANT
GOWN STRL REUS W/ TWL LRG LVL3 (GOWN DISPOSABLE) ×2 IMPLANT
GOWN STRL REUS W/ TWL XL LVL3 (GOWN DISPOSABLE) ×1 IMPLANT
LINER CANISTER 1000CC FLEX (MISCELLANEOUS) ×1 IMPLANT
NDL FILTER BLUNT 18X1 1/2 (NEEDLE) IMPLANT
NDL HYPO 25X1 1.5 SAFETY (NEEDLE) ×2 IMPLANT
NEEDLE FILTER BLUNT 18X1 1/2 (NEEDLE) ×1 IMPLANT
NEEDLE HYPO 25X1 1.5 SAFETY (NEEDLE) ×2 IMPLANT
NS IRRIG 1000ML POUR BTL (IV SOLUTION) IMPLANT
PACK BASIN DAY SURGERY FS (CUSTOM PROCEDURE TRAY) ×1 IMPLANT
PAD ALCOHOL SWAB (MISCELLANEOUS) IMPLANT
PAD FOAM SILICONE BACKED (GAUZE/BANDAGES/DRESSINGS) IMPLANT
PENCIL SMOKE EVACUATOR (MISCELLANEOUS) ×1 IMPLANT
PIN SAFETY STERILE (MISCELLANEOUS) IMPLANT
SLEEVE SCD COMPRESS KNEE MED (STOCKING) ×1 IMPLANT
SPIKE FLUID TRANSFER (MISCELLANEOUS) IMPLANT
SPONGE T-LAP 18X18 ~~LOC~~+RFID (SPONGE) ×2 IMPLANT
STRIP SUTURE WOUND CLOSURE 1/2 (MISCELLANEOUS) ×4 IMPLANT
SUT MNCRL AB 4-0 PS2 18 (SUTURE) ×4 IMPLANT
SUT MON AB 3-0 SH27 (SUTURE) ×4 IMPLANT
SUT MON AB 5-0 PS2 18 (SUTURE) IMPLANT
SUT PDS 3-0 CT2 (SUTURE)
SUT PDS AB 2-0 CT2 27 (SUTURE) IMPLANT
SUT PDS II 3-0 CT2 27 ABS (SUTURE) ×4 IMPLANT
SUT SILK 3 0 PS 1 (SUTURE) IMPLANT
SYR 10ML LL (SYRINGE) IMPLANT
SYR 50ML LL SCALE MARK (SYRINGE) IMPLANT
SYR BULB IRRIG 60ML STRL (SYRINGE) ×1 IMPLANT
SYR CONTROL 10ML LL (SYRINGE) ×2 IMPLANT
TAPE MEASURE VINYL STERILE (MISCELLANEOUS) IMPLANT
TOWEL GREEN STERILE FF (TOWEL DISPOSABLE) ×3 IMPLANT
TRAY DSU PREP LF (CUSTOM PROCEDURE TRAY) ×1 IMPLANT
TUBE CONNECTING 20X1/4 (TUBING) ×1 IMPLANT
TUBING INFILTRATION IT-10001 (TUBING) IMPLANT
TUBING SET GRADUATE ASPIR 12FT (MISCELLANEOUS) IMPLANT
UNDERPAD 30X36 HEAVY ABSORB (UNDERPADS AND DIAPERS) ×2 IMPLANT
YANKAUER SUCT BULB TIP NO VENT (SUCTIONS) ×1 IMPLANT

## 2023-12-01 NOTE — Transfer of Care (Signed)
Immediate Anesthesia Transfer of Care Note  Patient: Vicki French  Procedure(s) Performed: BREAST REDUCTION WITH LIPOSUCTION (Bilateral: Breast)  Patient Location: PACU  Anesthesia Type:General  Level of Consciousness: awake and drowsy  Airway & Oxygen Therapy: Patient Spontanous Breathing and Patient connected to face mask oxygen  Post-op Assessment: Report given to RN and Post -op Vital signs reviewed and stable  Post vital signs: Reviewed and stable  Last Vitals:  Vitals Value Taken Time  BP    Temp    Pulse    Resp    SpO2      Last Pain:  Vitals:   12/01/23 0823  TempSrc: Temporal  PainSc: 0-No pain      Patients Stated Pain Goal: 3 (12/01/23 0823)  Complications: No notable events documented.

## 2023-12-01 NOTE — Discharge Instructions (Addendum)
INSTRUCTIONS FOR AFTER BREAST SURGERY   You will likely have some questions about what to expect following your operation.  The following information will help you and your family understand what to expect when you are discharged from the hospital.  It is important to follow these guidelines to help ensure a smooth recovery and reduce complication.  Postoperative instructions include information on: diet, wound care, medications and physical activity.  AFTER SURGERY Expect to go home after the procedure.  In some cases, you may need to spend one night in the hospital for observation.  DIET Breast surgery does not require a specific diet.  However, the healthier you eat the better your body will heal. It is important to increasing your protein intake.  This means limiting the foods with sugar and carbohydrates.  Focus on vegetables and some meat.  If you have liposuction during your procedure be sure to drink water.  If your urine is bright yellow, then it is concentrated, and you need to drink more water.  As a general rule after surgery, you should have 8 ounces of water every hour while awake.  If you find you are persistently nauseated or unable to take in liquids let us know.  NO TOBACCO USE or EXPOSURE.  This will slow your healing process and lead to a wound.  WOUND CARE Leave the binder on for 3 days . Use fragrance free soap like Dial, Dove or [place].   After 3 days you can remove the binder to shower. Once dry apply binder or sports bra. If you have liposuction you will have a soft and spongy dressing (Lipofoam) that helps prevent creases in your skin.  Remove before you shower and then replace it.  It is also available on [employer]. If you have steri-strips / tape directly attached to your skin leave them in place. It is OK to get these wet.   No baths, pools or hot tubs for four weeks. We close your incision to leave the smallest and best-looking scar. No ointment or creams on your incisions  for four weeks.  No Neosporin (Too many skin reactions).  A few weeks after surgery you can use Mederma and start massaging the scar. We ask you to wear your binder or sports bra for the first 6 weeks around the clock, including while sleeping. This provides added comfort and helps reduce the fluid accumulation at the surgery site. NO Ice or heating pads to the operative site.  You have a very high risk of a BURN before you feel the temperature change.  ACTIVITY No heavy lifting until cleared by the doctor.  This usually means no more than a half-gallon of milk.  It is OK to walk and climb stairs. Moving your legs is very important to decrease your risk of a blood clot.  It will also help keep you from getting deconditioned.  Every 1 to 2 hours get up and walk for 5 minutes. This will help with a quicker recovery back to normal.  Let pain be your guide so you don't do too much.  This time is for you to recover.  You will be more comfortable if you sleep and rest with your head elevated either with a few pillows under you or in a recliner.  No stomach sleeping for a three months.  WORK Everyone returns to work at different times. As a rough guide, most people take at least 1 - 2 weeks off prior to returning to work. If  you need documentation for your job, give the forms to the front staff at the clinic.  DRIVING Arrange for someone to bring you home from the hospital after your surgery.  You may be able to drive a few days after surgery but not while taking any narcotics or valium.  BOWEL MOVEMENTS Constipation can occur after anesthesia and while taking pain medication.  It is important to stay ahead for your comfort.  We recommend taking Milk of Magnesia (2 tablespoons; twice a day) while taking the pain pills.  MEDICATIONS You may be prescribed should start after surgery At your preoperative visit for you history and physical you may have been given the following medications: An antibiotic: Start  this medication when you get home and take according to the instructions on the bottle. Zofran 4 mg:  This is to treat nausea and vomiting.  You can take this every 6 hours as needed and only if needed. Valium 2 mg for breast cancer patients: This is for muscle tightness if you have an implant or expander. This will help relax your muscle which also helps with pain control.  This can be taken every 12 hours as needed. Don't drive after taking this medication. Norco (hydrocodone/acetaminophen) 5/325 mg:  This is only to be used after you have taken the Motrin or the Tylenol. Every 8 hours as needed.   Over the counter Medication to take: Ibuprofen (Motrin) 600 mg:  Take this every 6 hours.  If you have additional pain then take 500 mg of the Tylenol every 8 hours.  Only take the Norco after you have tried these two. MiraLAX or Milk of Magnesia: Take this according to the bottle if you take the Norco.  WHEN TO CALL Call your surgeon's office if any of the following occur: Fever 101 degrees F or greater Excessive bleeding or fluid from the incision site. Pain that increases over time without aid from the medications Redness, warmth, or pus draining from incision sites Persistent nausea or inability to take in liquids Severe misshapen area that underwent the operation.   Information for Discharge Teaching: EXPAREL (bupivacaine liposome injectable suspension)   Pain relief is important to your recovery. The goal is to control your pain so you can move easier and return to your normal activities as soon as possible after your procedure. Your physician may use several types of medicines to manage pain, swelling, and more.  Your surgeon or anesthesiologist gave you EXPAREL(bupivacaine) to help control your pain after surgery.  EXPAREL is a local anesthetic designed to release slowly over an extended period of time to provide pain relief by numbing the tissue around the surgical site. EXPAREL is  designed to release pain medication over time and can control pain for up to 72 hours. Depending on how you respond to EXPAREL, you may require less pain medication during your recovery. EXPAREL can help reduce or eliminate the need for opioids during the first few days after surgery when pain relief is needed the most. EXPAREL is not an opioid and is not addictive. It does not cause sleepiness or sedation.   Important! A teal colored band has been placed on your arm with the date, time and amount of EXPAREL you have received. Please leave this armband in place for the full 96 hours following administration, and then you may remove the band. If you return to the hospital for any reason within 96 hours following the administration of EXPAREL, the armband provides important information that  your health care providers to know, and alerts them that you have received this anesthetic.    Possible side effects of EXPAREL: Temporary loss of sensation or ability to move in the area where medication was injected. Nausea, vomiting, constipation Rarely, numbness and tingling in your mouth or lips, lightheadedness, or anxiety may occur. Call your doctor right away if you think you may be experiencing any of these sensations, or if you have other questions regarding possible side effects.  Follow all other discharge instructions given to you by your surgeon or nurse. Eat a healthy diet and drink plenty of water or other fluids. Post Anesthesia Home Care Instructions  Activity: Get plenty of rest for the remainder of the day. A responsible individual must stay with you for 24 hours following the procedure.  For the next 24 hours, DO NOT: -Drive a car -Advertising copywriter -Drink alcoholic beverages -Take any medication unless instructed by your physician -Make any legal decisions or sign important papers.  Meals: Start with liquid foods such as gelatin or soup. Progress to regular foods as tolerated. Avoid  greasy, spicy, heavy foods. If nausea and/or vomiting occur, drink only clear liquids until the nausea and/or vomiting subsides. Call your physician if vomiting continues.  Special Instructions/Symptoms: Your throat may feel dry or sore from the anesthesia or the breathing tube placed in your throat during surgery. If this causes discomfort, gargle with warm salt water. The discomfort should disappear within 24 hours.  If you had a scopolamine patch placed behind your ear for the management of post- operative nausea and/or vomiting:  1. The medication in the patch is effective for 72 hours, after which it should be removed.  Wrap patch in a tissue and discard in the trash. Wash hands thoroughly with soap and water. 2. You may remove the patch earlier than 72 hours if you experience unpleasant side effects which may include dry mouth, dizziness or visual disturbances. 3. Avoid touching the patch. Wash your hands with soap and water after contact with the patch.      Next dose of Tylenol may be taken at 2:30p

## 2023-12-01 NOTE — Op Note (Signed)
Breast Reduction Op note:    DATE OF PROCEDURE: 12/01/2023  LOCATION: [facility]  SURGEON: Foster Simpson, DO  ASSISTANT: Keenan Bachelor, PA  PREOPERATIVE DIAGNOSIS 1. Macromastia 2. Neck Pain 3. Back Pain  POSTOPERATIVE DIAGNOSIS 1. Macromastia 2. Neck Pain 3. Back Pain  PROCEDURES 1. Bilateral breast reduction.  Right reduction 1370 g, Left reduction 1310 g  COMPLICATIONS: None.  DRAINS: none  INDICATIONS FOR PROCEDURE Vicki French is a 58 y.o. year-old female born on 1966-09-02,with a history of symptomatic macromastia with concominant back pain, neck pain, shoulder grooving from her bra.   MRN: [id number]  CONSENT Informed consent was obtained directly from the patient. The risks, benefits and alternatives were fully discussed. Specific risks including but not limited to bleeding, infection, hematoma, seroma, scarring, pain, nipple necrosis, asymmetry, poor cosmetic results, and need for further surgery were discussed. The patient's questions were answered.  DESCRIPTION OF PROCEDURE  Patient was brought into the operating room and rested on the operating room table in the supine position.  SCDs were placed and appropriate padding was performed.  Antibiotics were given. The patient underwent general anesthesia and the chest was prepped and draped in a sterile fashion.  A timeout was performed and all information was confirmed to be correct by those in the room.  Right side: Preoperative markings were confirmed.  Incision lines were injected with local containing epinephrine.  After waiting for vasoconstriction, the marked lines were incised with a #15 blade.  A Wise-pattern superomedial breast reduction was performed by de-epithelializing the pedicle, using bovie to create the superomedial pedicle, and removing breast tissue from the superior, lateral, and inferior portions of the breast.  Care was taken to not undermine the breast pedicle.  Hemostasis was achieved.  Experel and cellerate were placed in the pocket. The nipple was gently rotated into position and the soft tissue closed with 4-0 Monocryl.   The pocket was irrigated and hemostasis confirmed.  The deep tissues were approximated with 3-0 PDS sutures.  The skin was closed with deep dermal 3-0 Monocryl and subcuticular 4-0 Monocryl sutures.  The nipple and skin flaps had good capillary refill at the end of the procedure.    Left side: Preoperative markings were confirmed.  Incision lines were injected with local containing epinephrine.  After waiting for vasoconstriction, the marked lines were incised with a #15 blade.  A Wise-pattern superomedial breast reduction was performed by de-epithelializing the pedicle, using bovie to create the superomedial pedicle, and removing breast tissue from the superior, lateral, and inferior portions of the breast. Experel and cellerate were placed in the pocket.  Care was taken to not undermine the breast pedicle. Hemostasis was achieved.  The nipple was gently rotated into position and the soft tissue was closed with 4-0 Monocryl.  The patient was sat upright and size and shape symmetry was confirmed.  The pocket was irrigated and hemostasis confirmed.  The deep tissues were approximated with 3-0 PDS sutures. The skin was closed with deep dermal 3-0 Monocryl and subcuticular 4-0 Monocryl sutures.  Dermabond was applied.  A breast binder and ABDs were placed.  The nipple and skin flaps had good capillary refill at the end of the procedure.  The patient tolerated the procedure well. The patient was allowed to wake from anesthesia and taken to the recovery room in satisfactory condition.  The advanced practice practitioner (APP) assisted throughout the case.  The APP was essential in retraction and counter traction when needed to make  the case progress smoothly.  This retraction and assistance made it possible to see the tissue plans for the procedure.   The assistance was needed for blood control, tissue re-approximation and assisted with closure of the incision site.

## 2023-12-01 NOTE — Interval H&P Note (Signed)
History and Physical Interval Note:  12/01/2023 9:17 AM  Vicki French  has presented today for surgery, with the diagnosis of macromastia.  The various methods of treatment have been discussed with the patient and family. After consideration of risks, benefits and other options for treatment, the patient has consented to  Procedure(s): BREAST REDUCTION WITH LIPOSUCTION (Bilateral) as a surgical intervention.  The patient's history has been reviewed, patient examined, no change in status, stable for surgery.  I have reviewed the patient's chart and labs.  Questions were answered to the patient's satisfaction.     Alena Bills Toinette Lackie

## 2023-12-01 NOTE — Anesthesia Procedure Notes (Signed)
Procedure Name: Intubation Date/Time: 12/01/2023 10:05 AM  Performed by: Ronnette Hila, CRNAPre-anesthesia Checklist: Patient identified, Emergency Drugs available, Suction available and Patient being monitored Patient Re-evaluated:Patient Re-evaluated prior to induction Oxygen Delivery Method: Circle system utilized Preoxygenation: Pre-oxygenation with 100% oxygen Induction Type: IV induction Ventilation: Mask ventilation without difficulty Laryngoscope Size: Miller and 3 Grade View: Grade II Tube type: Oral Tube size: 7.0 mm Number of attempts: 1 Airway Equipment and Method: Stylet and Oral airway Placement Confirmation: ETT inserted through vocal cords under direct vision, positive ETCO2 and breath sounds checked- equal and bilateral Secured at: 22 cm Tube secured with: Tape Dental Injury: Teeth and Oropharynx as per pre-operative assessment

## 2023-12-01 NOTE — Anesthesia Preprocedure Evaluation (Addendum)
Anesthesia Evaluation  Patient identified by MRN, date of birth, ID band Patient awake    Reviewed: Allergy & Precautions, H&P , NPO status , Patient's Chart, lab work & pertinent test results  Airway Mallampati: II  TM Distance: >3 FB Neck ROM: Full    Dental no notable dental hx.    Pulmonary neg pulmonary ROS   Pulmonary exam normal breath sounds clear to auscultation       Cardiovascular hypertension, Normal cardiovascular exam Rhythm:Regular Rate:Normal  Hx of atrial flutter   Neuro/Psych negative neurological ROS  negative psych ROS   GI/Hepatic negative GI ROS, Neg liver ROS,,,  Endo/Other  negative endocrine ROS    Renal/GU negative Renal ROS  negative genitourinary   Musculoskeletal  (+) Arthritis ,    Abdominal   Peds negative pediatric ROS (+)  Hematology negative hematology ROS (+)   Anesthesia Other Findings   Reproductive/Obstetrics negative OB ROS                             Anesthesia Physical Anesthesia Plan  ASA: 2  Anesthesia Plan: General   Post-op Pain Management: Tylenol PO (pre-op)*   Induction: Intravenous  PONV Risk Score and Plan: 3 and Ondansetron, Dexamethasone and Treatment may vary due to age or medical condition  Airway Management Planned: Oral ETT  Additional Equipment:   Intra-op Plan:   Post-operative Plan: Extubation in OR  Informed Consent: I have reviewed the patients History and Physical, chart, labs and discussed the procedure including the risks, benefits and alternatives for the proposed anesthesia with the patient or authorized representative who has indicated his/her understanding and acceptance.     Dental advisory given  Plan Discussed with: CRNA  Anesthesia Plan Comments:        Anesthesia Quick Evaluation

## 2023-12-02 ENCOUNTER — Encounter (HOSPITAL_BASED_OUTPATIENT_CLINIC_OR_DEPARTMENT_OTHER): Payer: Self-pay | Admitting: Plastic Surgery

## 2023-12-02 LAB — SURGICAL PATHOLOGY

## 2023-12-02 NOTE — Anesthesia Postprocedure Evaluation (Signed)
Anesthesia Post Note  Patient: Servando Salina  Procedure(s) Performed: BREAST REDUCTION WITH LIPOSUCTION (Bilateral: Breast)     Patient location during evaluation: PACU Anesthesia Type: General Level of consciousness: awake and alert Pain management: pain level controlled Vital Signs Assessment: post-procedure vital signs reviewed and stable Respiratory status: spontaneous breathing, nonlabored ventilation, respiratory function stable and patient connected to nasal cannula oxygen Cardiovascular status: blood pressure returned to baseline and stable Postop Assessment: no apparent nausea or vomiting Anesthetic complications: no   No notable events documented.  Last Vitals:  Vitals:   12/01/23 1315 12/01/23 1330  BP: 138/88 (!) 144/85  Pulse: 84 89  Resp: 14 16  Temp:  (!) 36.3 C  SpO2: 95% 95%    Last Pain:  Vitals:   12/02/23 0925  TempSrc:   PainSc: 0-No pain                 [place]

## 2023-12-03 ENCOUNTER — Ambulatory Visit (INDEPENDENT_AMBULATORY_CARE_PROVIDER_SITE_OTHER): Payer: BC Managed Care – PPO | Admitting: Surgical

## 2023-12-03 DIAGNOSIS — G8929 Other chronic pain: Secondary | ICD-10-CM

## 2023-12-03 DIAGNOSIS — N62 Hypertrophy of breast: Secondary | ICD-10-CM

## 2023-12-03 DIAGNOSIS — M546 Pain in thoracic spine: Secondary | ICD-10-CM

## 2023-12-03 NOTE — Progress Notes (Signed)
     Patient ID: Vicki French, female    DOB: Apr 30, 1966, 58 y.o.   MRN: [id number]  Chief Complaint  Patient presents with   Follow-up      ICD-10-CM   1. Symptomatic mammary hypertrophy  N62     2. Hypertrophy of breast  N62     3. Chronic bilateral thoracic back pain  M54.6    G89.29       History of Present Illness: Vicki French is a 58 y.o.  female who presents for virtual post operative evaluation after bilateral breast reduction with Dr.  Ulice Bold 2 days ago.  Vicki French reports she is doing really well, she reports pain has been much better than she expected.  She has been up and active, reports that pain has been well-controlled with ibuprofen and occasional oxycodone.  She reports she is having positive flatus, but no BMs yet.  She is urinating normally.  She does not report any specific concerns at this time.  The patient gave consent to have this visit done by telemedicine / virtual visit, two identifiers were used to identify patient. This is also consent for access the chart and treat the patient via this visit. The patient is located in [place].  I, the provider, am at the office.  We spent 5 minutes together for the visit.  Joined by telephone.     Assessment/Plan:  Patient is doing well 2 days after bilateral breast reduction with Dr. Ulice Bold, she is not having any specific concerns or symptoms.  Recommend continue with compressive garments and avoiding strenuous activities.  We discussed increasing fluid and possibly using MiraLAX if necessary if she does not start having BM shortly.  Recommend continuing to avoid strenuous activities/heavy lifting Recommend calling with questions or concerns

## 2023-12-10 ENCOUNTER — Ambulatory Visit (INDEPENDENT_AMBULATORY_CARE_PROVIDER_SITE_OTHER): Payer: BC Managed Care – PPO | Admitting: Plastic Surgery

## 2023-12-10 ENCOUNTER — Encounter: Payer: Self-pay | Admitting: Plastic Surgery

## 2023-12-10 VITALS — BP 124/78 | HR 95

## 2023-12-10 DIAGNOSIS — N62 Hypertrophy of breast: Secondary | ICD-10-CM

## 2023-12-10 NOTE — Progress Notes (Signed)
The patient is a 58 year old female here for follow-up after undergoing a breast reduction January 22.  She had over 1300 g removed from both breasts.She is doing very well and is very pleased with her results.  She has a little bit of swelling and fullness in each breast laterally.  This is normal and to be expected.  Will plan to see her back in 1 to 2 weeks.  Continue with the sports bra or binder as long as it is not too tight.  Pictures were obtained of the patient and placed in the chart with the patient's or guardian's permission.

## 2023-12-16 ENCOUNTER — Telehealth: Payer: Self-pay | Admitting: Plastic Surgery

## 2023-12-16 NOTE — Telephone Encounter (Signed)
 Pt called about her bill for surgery. Pt stated she was authorized for sx. Pt received a bill and the insurance said they will not cover it and she wants to know what to do. I let her know I would send a message to the sx schedulers and billing to see if they can explain. I see the sx charges pending, pt stated it over 6000.00 please advise

## 2023-12-16 NOTE — Telephone Encounter (Signed)
 Pt called about her bill for surgery. Pt stated she was authorized for sx. Pt received a bill and the insurance said they will not cover it and she wants to know what to do. I let her know I would send a message to the sx schedulers and billing to see if they can explain. I see the sx charges pending, pt stated it over 6000.00 please advise pt just wants to know what to do.

## 2023-12-17 ENCOUNTER — Telehealth: Payer: Self-pay | Admitting: Plastic Surgery

## 2023-12-17 NOTE — Telephone Encounter (Signed)
 Called patient to discuss her concerns, left voicemail message.  Patient did not answer.  If patient calls back, I would recommend washing the affected area with gentle soap and water.  Taking Zyrtec or Allegra daily and Benadryl at night to assist with itching.  If it does not improve over the weekend, we should certainly see her next week to evaluate

## 2023-12-17 NOTE — Telephone Encounter (Signed)
 Patient is experiencing a rash and bumps around bra area and going down under arm, please reach out and advise

## 2023-12-20 ENCOUNTER — Telehealth: Payer: Self-pay

## 2023-12-20 NOTE — Telephone Encounter (Signed)
 I attempted to call the patient, but there was no answer. I left a voicemail following up about her concerns from Friday. If the patient calls back I was going to confirm if she was able to receive the advice from our PA which was "Wash the affected area with gentle soap and water. Take Zyrtec or Allegra daily and Benadryl at night to assist with itching".

## 2023-12-21 ENCOUNTER — Ambulatory Visit (INDEPENDENT_AMBULATORY_CARE_PROVIDER_SITE_OTHER): Payer: BC Managed Care – PPO | Admitting: Surgical

## 2023-12-21 ENCOUNTER — Encounter: Payer: Self-pay | Admitting: Surgical

## 2023-12-21 VITALS — BP 132/80 | HR 90 | Ht 67.5 in | Wt 225.8 lb

## 2023-12-21 DIAGNOSIS — N62 Hypertrophy of breast: Secondary | ICD-10-CM

## 2023-12-21 DIAGNOSIS — M542 Cervicalgia: Secondary | ICD-10-CM

## 2023-12-21 DIAGNOSIS — G8929 Other chronic pain: Secondary | ICD-10-CM

## 2023-12-21 DIAGNOSIS — Z9889 Other specified postprocedural states: Secondary | ICD-10-CM

## 2023-12-21 NOTE — Progress Notes (Signed)
Patient is a  58 y.o.-year-old female status post bilateral breast reduction with Dr.  Ulice Bold  Patient is 3 weeks postop.  Patient reports she is overall doing well, she is not having any infectious symptoms.  She does report that she has been having itching of her bilateral breasts and arms, has been using Benadryl which has been helpful.  The itching seems to be improving   Chaperone present on exam Bilateral NAC's are viable, bilateral breast incisions are overall intact.  She does have some very minimal separation of the right superior vertical limb with some scant drainage noted on exam.  She does have a maculopapular rash of bilateral breast surrounding bilateral NAC's were Dermabond was applied.  She also has some blistering noted where the Steri-Strips are in place, these appear to be healing well and have scabbed over. There is no erythema or cellulitic changes noted. No subcutaneous fluid collections noted with palpation.  She does have some postsurgical swelling.  A/P:  Some sutures and Dermabond were removed from bilateral breast incisions, Steri-Strips were removed.  Encouraged patient to continue to take Benadryl at night and Zyrtec or Allegra during the day to assist with itching.  Discussed the importance of applying Vaseline daily or multiple times per day to assist with removal of Dermabond.  Vaseline was applied today in the office.  Discussed with patient she could try warm compresses as well to assist with removing the Dermabond.  I do not see any signs of infection or concern on exam.  Recommend continuing with compressive garment 24/7 until 6 weeks post-op,  avoiding strenuous activity/heavy lifting until 6 weeks post-op  Recommend following up in 1 week for reevaluation All the patient's questions were answered to their content. Recommend calling with any questions or concerns.

## 2023-12-27 ENCOUNTER — Telehealth: Payer: Self-pay | Admitting: Plastic Surgery

## 2023-12-27 NOTE — Telephone Encounter (Signed)
Patient stated that insurance denied coverage for Brst Red and would like to appeal, please advise

## 2023-12-28 ENCOUNTER — Encounter: Payer: Self-pay | Admitting: Surgical

## 2023-12-28 ENCOUNTER — Ambulatory Visit (INDEPENDENT_AMBULATORY_CARE_PROVIDER_SITE_OTHER): Payer: BC Managed Care – PPO | Admitting: Surgical

## 2023-12-28 VITALS — BP 128/83 | HR 98 | Ht 67.5 in | Wt 221.6 lb

## 2023-12-28 DIAGNOSIS — N62 Hypertrophy of breast: Secondary | ICD-10-CM

## 2023-12-28 DIAGNOSIS — G8929 Other chronic pain: Secondary | ICD-10-CM

## 2023-12-28 DIAGNOSIS — M546 Pain in thoracic spine: Secondary | ICD-10-CM

## 2023-12-28 NOTE — Progress Notes (Signed)
Patient is a 58 year old female status post bilateral breast reduction with Dr. Ulice Bold on 12/01/2023.  She is 4 weeks postop.  At her last appointment, she was having some itching of her breasts and a rash.  Patient reports today that she is overall doing much better, rash has significantly improved and itching has nearly resolved.  She does report some ongoing mild pain in the left breast when she goes from sitting to standing, but is otherwise doing well.  She does report that she received a bill in the mail from [facility] health and stated that she received a letter that her insurance did not plan to cover her surgery.  She has questions about this.  Chaperone present on exam Bilateral NAC's are viable, bilateral breast incisions are intact. There is no erythema or cellulitic changes noted. No large subcutaneous fluid collections noted with palpation. All of her incisions are well-healed. Rash significantly improved, no active rash noted on exam.  A/P:  Recommend continuing with compressive garment 24/7 until 6 weeks post-op,  avoiding strenuous activity/heavy lifting until 6 weeks post-op  There is no signs infection or concern on exam.  Recommend following up in 2 weeks for reevaluation prior to returning to work. All the patient's questions were answered to their content. Recommend calling with any questions or concerns.  Discussed with patient that I notified our surgical scheduling team of her questions about insurance not covering her surgery, I was notified that she should reach out to accounting at [facility] as her insurance did not require a prior authorization for surgery.  Discussed with patient that she should call the number on her bill to discuss her questions and that we could be available for any assistance as needed.

## 2024-01-03 ENCOUNTER — Telehealth: Payer: Self-pay | Admitting: Plastic Surgery

## 2024-01-03 NOTE — Telephone Encounter (Signed)
 Patient would like to know if you could write a note for her to send to the insurance company, stating Brst Red was necessary and so that she can start an appeal because insurance didn't cover it.

## 2024-01-04 ENCOUNTER — Encounter: Payer: BC Managed Care – PPO | Admitting: Surgical

## 2024-01-11 ENCOUNTER — Ambulatory Visit: Payer: BC Managed Care – PPO | Admitting: Surgical

## 2024-01-11 ENCOUNTER — Encounter: Payer: Self-pay | Admitting: Surgical

## 2024-01-11 VITALS — BP 157/95 | HR 95

## 2024-01-11 DIAGNOSIS — N62 Hypertrophy of breast: Secondary | ICD-10-CM

## 2024-01-11 NOTE — Progress Notes (Signed)
 Patient is a  58 y.o.-year-old female status post bilateral breast reduction with Dr.  Ulice Bold. Patient is 6 weeks postop.  She reports she is overall doing well.  She does reports she has noticed some drainage on her left breast garment around the areola.  She is wondering if this is from the nipple or from the incision around the nipple.  She is not having any infectious symptoms.  She reports she is overall doing well.  She does report she is still having issues with insurance stating that they will not cover her breast reduction surgery.  I previously discussed with surgical scheduling team about this and they stated she would need to reach out to central billing to discuss, her insurance did not require a prior authorization.  We did provide her with a letter today stating that her surgery was medically necessary, she had significant skin changes from her bra straps including hypopigmentation from skin loss on her shoulders from her bra straps, shoulder indentation, upper back and neck pain.  Chaperone present on exam Bilateral NAC's are viable, bilateral breast incisions are intact. She does have inversion of the left nipple.  She does have a small suture protruding to the skin around 11:00 of the left nipple, no active drainage noted.  No active drainage from nipples noted. There is no erythema or cellulitic changes noted. No subcutaneous fluid collections noted with palpation.   A/P:  Patient is overall healing well, there is no signs infection or concern on exam.  Discussed with patient to continue to monitor drainage, this could be from the small suture that was still present in the skin on the left nipple.  If she continues to have nipple areolar drainage after suture removal today, discussed with her to please notify us and we may need to reevaluate.  Recommend following up as needed. All the patient's questions were answered to their content. Recommend calling with any questions or  concerns.  Discussed with patient that if she does not have resolution after discussing with central billing insurance questions/concerns to please notify our office and we could be of any assistance.

## 2024-05-31 ENCOUNTER — Encounter: Payer: Self-pay | Admitting: General Surgery

## 2024-07-14 ENCOUNTER — Telehealth: Payer: Self-pay | Admitting: Plastic Surgery

## 2024-07-14 NOTE — Telephone Encounter (Signed)
 Patient calling asking about the process of the appeal, please advise

## 2024-10-31 ENCOUNTER — Other Ambulatory Visit: Payer: Self-pay | Admitting: General Surgery

## 2024-10-31 DIAGNOSIS — Z1231 Encounter for screening mammogram for malignant neoplasm of breast: Secondary | ICD-10-CM

## 2024-12-05 ENCOUNTER — Ambulatory Visit

## 2024-12-18 ENCOUNTER — Ambulatory Visit
